# Patient Record
Sex: Female | Born: 1935 | Race: White | Hispanic: No | Smoking: Never smoker
Health system: Southern US, Community
[De-identification: ages and names within clinical notes are randomized; demographics above are authoritative.]

## PROBLEM LIST (undated history)

## (undated) DIAGNOSIS — E079 Disorder of thyroid, unspecified: Secondary | ICD-10-CM

## (undated) DIAGNOSIS — N390 Urinary tract infection, site not specified: Secondary | ICD-10-CM

## (undated) DIAGNOSIS — D649 Anemia, unspecified: Secondary | ICD-10-CM

## (undated) DIAGNOSIS — J449 Chronic obstructive pulmonary disease, unspecified: Secondary | ICD-10-CM

## (undated) DIAGNOSIS — I4891 Unspecified atrial fibrillation: Secondary | ICD-10-CM

## (undated) DIAGNOSIS — E119 Type 2 diabetes mellitus without complications: Secondary | ICD-10-CM

## (undated) DIAGNOSIS — I6200 Nontraumatic subdural hemorrhage, unspecified: Secondary | ICD-10-CM

## (undated) DIAGNOSIS — I509 Heart failure, unspecified: Secondary | ICD-10-CM

## (undated) HISTORY — PX: CORONARY ARTERY BYPASS GRAFT: SHX141

---

## 2013-01-23 ENCOUNTER — Ambulatory Visit: Payer: Self-pay | Admitting: Cardiovascular Disease

## 2013-01-23 ENCOUNTER — Observation Stay: Payer: Self-pay | Admitting: Internal Medicine

## 2013-01-23 LAB — CBC
HCT: 18.4 % — ABNORMAL LOW (ref 35.0–47.0)
HGB: 6 g/dL — ABNORMAL LOW (ref 12.0–16.0)
MCH: 30.1 pg (ref 26.0–34.0)
Platelet: 107 10*3/uL — ABNORMAL LOW (ref 150–440)
RBC: 2 10*6/uL — ABNORMAL LOW (ref 3.80–5.20)
RDW: 14.2 % (ref 11.5–14.5)
WBC: 4.7 10*3/uL (ref 3.6–11.0)

## 2013-01-23 LAB — COMPREHENSIVE METABOLIC PANEL
Albumin: 1.8 g/dL — ABNORMAL LOW (ref 3.4–5.0)
Alkaline Phosphatase: 55 U/L (ref 50–136)
BUN: 17 mg/dL (ref 7–18)
Creatinine: 0.54 mg/dL — ABNORMAL LOW (ref 0.60–1.30)
EGFR (African American): >60
EGFR (Non-African Amer.): >60
SGPT (ALT): 17 U/L (ref 12–78)
Sodium: 145 mmol/L (ref 136–145)

## 2013-01-23 LAB — URINALYSIS, COMPLETE
Bilirubin,UR: NEGATIVE
Glucose,UR: NEGATIVE mg/dL (ref 0–75)
Ph: 7 (ref 4.5–8.0)
RBC,UR: 1 /HPF (ref 0–5)
Specific Gravity: 1.041 (ref 1.003–1.030)
WBC UR: 2 /HPF (ref 0–5)

## 2013-01-23 LAB — PROTIME-INR
INR: 1.4
Prothrombin Time: 16.9 secs — ABNORMAL HIGH (ref 11.5–14.7)

## 2013-01-24 LAB — CBC WITH DIFFERENTIAL/PLATELET
Basophil %: 0.6 %
Eosinophil #: 0.1 10*3/uL (ref 0.0–0.7)
Eosinophil %: 2.1 %
Lymphocyte %: 17.1 %
MCH: 29.8 pg (ref 26.0–34.0)
MCHC: 34 g/dL (ref 32.0–36.0)
MCV: 88 fL (ref 80–100)
Monocyte #: 0.9 x10 3/mm (ref 0.2–0.9)
Monocyte %: 13.3 %
Neutrophil %: 66.9 %
RDW: 14.7 % — ABNORMAL HIGH (ref 11.5–14.5)

## 2013-01-25 LAB — CBC WITH DIFFERENTIAL/PLATELET
Eosinophil #: 0.3 10*3/uL (ref 0.0–0.7)
Eosinophil %: 4.1 %
HCT: 26.3 % — ABNORMAL LOW (ref 35.0–47.0)
HGB: 8.9 g/dL — ABNORMAL LOW (ref 12.0–16.0)
Lymphocyte %: 15.8 %
MCV: 89 fL (ref 80–100)
Monocyte #: 1 x10 3/mm — ABNORMAL HIGH (ref 0.2–0.9)
Neutrophil %: 65.4 %
Platelet: 124 10*3/uL — ABNORMAL LOW (ref 150–440)
RBC: 2.96 10*6/uL — ABNORMAL LOW (ref 3.80–5.20)
RDW: 14.7 % — ABNORMAL HIGH (ref 11.5–14.5)

## 2013-01-25 LAB — BASIC METABOLIC PANEL
Anion Gap: 2 — ABNORMAL LOW (ref 7–16)
Calcium, Total: 8.7 mg/dL (ref 8.5–10.1)
Co2: 36 mmol/L — ABNORMAL HIGH (ref 21–32)
Creatinine: 0.97 mg/dL (ref 0.60–1.30)
EGFR (Non-African Amer.): 55 — ABNORMAL LOW
Glucose: 80 mg/dL (ref 65–99)
Osmolality: 282 (ref 275–301)
Potassium: 4.3 mmol/L (ref 3.5–5.1)

## 2013-05-12 ENCOUNTER — Ambulatory Visit: Payer: Self-pay | Admitting: Gastroenterology

## 2013-05-21 ENCOUNTER — Ambulatory Visit: Payer: Self-pay | Admitting: Gastroenterology

## 2013-10-02 ENCOUNTER — Inpatient Hospital Stay: Payer: Self-pay | Admitting: Internal Medicine

## 2013-10-02 LAB — COMPREHENSIVE METABOLIC PANEL WITH GFR
Albumin: 3 g/dL — ABNORMAL LOW
Alkaline Phosphatase: 144 U/L — ABNORMAL HIGH
Anion Gap: 0 — ABNORMAL LOW
BUN: 18 mg/dL
Bilirubin,Total: 0.2 mg/dL
Calcium, Total: 8.9 mg/dL
Chloride: 101 mmol/L
Co2: 39 mmol/L — ABNORMAL HIGH
Creatinine: 1.06 mg/dL
EGFR (African American): 57 — ABNORMAL LOW
EGFR (Non-African Amer.): 49 — ABNORMAL LOW
Glucose: 111 mg/dL — ABNORMAL HIGH
Osmolality: 282
Potassium: 4.4 mmol/L
SGOT(AST): 24 U/L
SGPT (ALT): 31 U/L
Sodium: 140 mmol/L
Total Protein: 7 g/dL

## 2013-10-02 LAB — URINALYSIS, COMPLETE
Bacteria: NONE SEEN
Bilirubin,UR: NEGATIVE
Blood: NEGATIVE
Glucose,UR: NEGATIVE mg/dL
Ketone: NEGATIVE
Nitrite: POSITIVE
Ph: 7
Protein: NEGATIVE
RBC,UR: 1 /HPF
Specific Gravity: 1.011
Squamous Epithelial: NONE SEEN
WBC UR: 27 /HPF

## 2013-10-02 LAB — CBC WITH DIFFERENTIAL/PLATELET
Basophil #: 0.1 10*3/uL (ref 0.0–0.1)
Basophil %: 0.4 %
Eosinophil #: 0.2 10*3/uL (ref 0.0–0.7)
Eosinophil %: 1.2 %
Lymphocyte #: 0.8 10*3/uL — ABNORMAL LOW (ref 1.0–3.6)
MCH: 29.5 pg (ref 26.0–34.0)
MCHC: 32.4 g/dL (ref 32.0–36.0)
MCV: 91 fL (ref 80–100)
Neutrophil #: 12 10*3/uL — ABNORMAL HIGH (ref 1.4–6.5)
Neutrophil %: 86.3 %
RDW: 13.6 % (ref 11.5–14.5)
WBC: 13.9 10*3/uL — ABNORMAL HIGH (ref 3.6–11.0)

## 2013-10-02 LAB — TROPONIN I
Troponin-I: <0.02 ng/mL
Troponin-I: <0.02 ng/mL
Troponin-I: <0.02 ng/mL

## 2013-10-02 LAB — DRUG SCREEN, URINE
Barbiturates, Ur Screen: NEGATIVE (ref ?–200)
Cannabinoid 50 Ng, Ur ~~LOC~~: NEGATIVE (ref ?–50)
MDMA (Ecstasy)Ur Screen: NEGATIVE (ref ?–500)
Methadone, Ur Screen: NEGATIVE (ref ?–300)
Opiate, Ur Screen: POSITIVE (ref ?–300)
Phencyclidine (PCP) Ur S: NEGATIVE (ref ?–25)
Tricyclic, Ur Screen: NEGATIVE (ref ?–1000)

## 2013-10-02 LAB — MAGNESIUM: Magnesium: 1.9 mg/dL

## 2013-10-02 LAB — PROTIME-INR
INR: 1.1
Prothrombin Time: 14.4 s

## 2013-10-02 LAB — APTT: Activated PTT: 33.5 s

## 2013-10-02 LAB — CK TOTAL AND CKMB (NOT AT ARMC)
CK, Total: 128 U/L (ref 21–215)
CK-MB: 2.3 ng/mL (ref 0.5–3.6)

## 2013-10-02 LAB — LIPASE, BLOOD: Lipase: 459 U/L — ABNORMAL HIGH

## 2013-10-03 LAB — CBC WITH DIFFERENTIAL/PLATELET
Basophil %: 0.3 %
Eosinophil %: 0 %
Lymphocyte %: 5.8 %
MCH: 29.6 pg (ref 26.0–34.0)
MCHC: 33.5 g/dL (ref 32.0–36.0)
Monocyte %: 9.1 %
Neutrophil %: 84.8 %
Platelet: 172 10*3/uL (ref 150–440)
WBC: 20.2 10*3/uL — ABNORMAL HIGH (ref 3.6–11.0)

## 2013-10-03 LAB — CK TOTAL AND CKMB (NOT AT ARMC): CK-MB: 2.2 ng/mL (ref 0.5–3.6)

## 2013-10-03 LAB — BASIC METABOLIC PANEL
Anion Gap: 6 — ABNORMAL LOW (ref 7–16)
BUN: 20 mg/dL — ABNORMAL HIGH (ref 7–18)
Chloride: 101 mmol/L (ref 98–107)
Creatinine: 0.86 mg/dL (ref 0.60–1.30)
EGFR (Non-African Amer.): >60
Glucose: 217 mg/dL — ABNORMAL HIGH (ref 65–99)
Osmolality: 281 (ref 275–301)
Potassium: 3.9 mmol/L (ref 3.5–5.1)

## 2013-10-04 LAB — CBC WITH DIFFERENTIAL/PLATELET
Basophil #: 0.1 10*3/uL (ref 0.0–0.1)
Basophil %: 0.5 %
Eosinophil #: 0.1 10*3/uL (ref 0.0–0.7)
Eosinophil %: 1 %
HGB: 6.7 g/dL — ABNORMAL LOW (ref 12.0–16.0)
Lymphocyte #: 1.1 10*3/uL (ref 1.0–3.6)
Lymphocyte %: 9.1 %
MCHC: 33.2 g/dL (ref 32.0–36.0)
MCV: 88 fL (ref 80–100)
Monocyte #: 1.5 x10 3/mm — ABNORMAL HIGH (ref 0.2–0.9)
Monocyte %: 12.4 %
RBC: 2.29 10*6/uL — ABNORMAL LOW (ref 3.80–5.20)
RDW: 14.5 % (ref 11.5–14.5)

## 2013-10-04 LAB — URINE CULTURE

## 2013-10-04 LAB — HEMOGLOBIN
HGB: 7.8 g/dL — ABNORMAL LOW (ref 12.0–16.0)
HGB: 8.2 g/dL — ABNORMAL LOW (ref 12.0–16.0)

## 2013-10-05 LAB — CBC WITH DIFFERENTIAL/PLATELET
Basophil #: 0 10*3/uL (ref 0.0–0.1)
Basophil %: 0.3 %
Eosinophil #: 0.1 10*3/uL (ref 0.0–0.7)
Eosinophil %: 0.9 %
HGB: 7.6 g/dL — ABNORMAL LOW (ref 12.0–16.0)
MCHC: 33.3 g/dL (ref 32.0–36.0)
Monocyte #: 1.3 x10 3/mm — ABNORMAL HIGH (ref 0.2–0.9)
Monocyte %: 10.8 %
Neutrophil %: 79.9 %
Platelet: 130 10*3/uL — ABNORMAL LOW (ref 150–440)
RBC: 2.58 10*6/uL — ABNORMAL LOW (ref 3.80–5.20)
WBC: 12.4 10*3/uL — ABNORMAL HIGH (ref 3.6–11.0)

## 2013-10-05 LAB — BASIC METABOLIC PANEL
BUN: 15 mg/dL (ref 7–18)
Chloride: 99 mmol/L (ref 98–107)
Co2: 33 mmol/L — ABNORMAL HIGH (ref 21–32)
Creatinine: 0.71 mg/dL (ref 0.60–1.30)
EGFR (African American): >60
Glucose: 94 mg/dL (ref 65–99)
Sodium: 135 mmol/L — ABNORMAL LOW (ref 136–145)

## 2013-10-06 LAB — POTASSIUM: Potassium: 3.6 mmol/L (ref 3.5–5.1)

## 2013-10-06 LAB — CBC WITH DIFFERENTIAL/PLATELET
Eosinophil %: 1.2 %
HCT: 24.4 % — ABNORMAL LOW (ref 35.0–47.0)
Lymphocyte #: 0.9 10*3/uL — ABNORMAL LOW (ref 1.0–3.6)
Neutrophil #: 9.3 10*3/uL — ABNORMAL HIGH (ref 1.4–6.5)
Neutrophil %: 80.1 %
Platelet: 144 10*3/uL — ABNORMAL LOW (ref 150–440)
RBC: 2.79 10*6/uL — ABNORMAL LOW (ref 3.80–5.20)
RDW: 14 % (ref 11.5–14.5)

## 2013-10-06 LAB — MAGNESIUM: Magnesium: 1.6 mg/dL — ABNORMAL LOW

## 2013-10-06 LAB — SODIUM: Sodium: 134 mmol/L — ABNORMAL LOW (ref 136–145)

## 2013-10-07 ENCOUNTER — Encounter: Payer: Self-pay | Admitting: Internal Medicine

## 2013-10-07 LAB — HEMOGLOBIN: HGB: 9.4 g/dL — ABNORMAL LOW (ref 12.0–16.0)

## 2013-10-07 LAB — SODIUM: Sodium: 137 mmol/L (ref 136–145)

## 2013-10-09 ENCOUNTER — Encounter: Payer: Self-pay | Admitting: Internal Medicine

## 2013-10-15 LAB — CBC WITH DIFFERENTIAL/PLATELET
BASOS ABS: 1 %
Bands: 1 %
EOS PCT: 3 %
HCT: 30.1 % — AB (ref 35.0–47.0)
HGB: 9.9 g/dL — ABNORMAL LOW (ref 12.0–16.0)
Lymphocytes: 18 %
MCH: 30.4 pg (ref 26.0–34.0)
MCHC: 33 g/dL (ref 32.0–36.0)
MCV: 92 fL (ref 80–100)
MONOS PCT: 10 %
Metamyelocyte: 1 %
Myelocyte: 0 %
PLATELETS: 319 10*3/uL (ref 150–440)
RBC: 3.26 10*6/uL — ABNORMAL LOW (ref 3.80–5.20)
RDW: 15.8 % — AB (ref 11.5–14.5)
Segmented Neutrophils: 63 %
Variant Lymphocyte - H1-Rlymph: 3 %
WBC: 7.1 10*3/uL (ref 3.6–11.0)

## 2013-10-16 LAB — BASIC METABOLIC PANEL
Anion Gap: 2 — ABNORMAL LOW (ref 7–16)
BUN: 14 mg/dL (ref 7–18)
CALCIUM: 8.2 mg/dL — AB (ref 8.5–10.1)
CO2: 34 mmol/L — AB (ref 21–32)
CREATININE: 0.78 mg/dL (ref 0.60–1.30)
Chloride: 99 mmol/L (ref 98–107)
EGFR (African American): >60
GLUCOSE: 108 mg/dL — AB (ref 65–99)
Osmolality: 271 (ref 275–301)
Potassium: 4.1 mmol/L (ref 3.5–5.1)
Sodium: 135 mmol/L — ABNORMAL LOW (ref 136–145)

## 2013-10-16 LAB — MAGNESIUM: MAGNESIUM: 1.9 mg/dL

## 2013-10-25 LAB — CBC WITH DIFFERENTIAL/PLATELET
BASOS PCT: 0.5 %
Basophil #: 0 10*3/uL (ref 0.0–0.1)
Eosinophil #: 0 10*3/uL (ref 0.0–0.7)
Eosinophil %: 0.1 %
HCT: 31.7 % — AB (ref 35.0–47.0)
HGB: 10.7 g/dL — ABNORMAL LOW (ref 12.0–16.0)
LYMPHS PCT: 5.3 %
Lymphocyte #: 0.3 10*3/uL — ABNORMAL LOW (ref 1.0–3.6)
MCH: 31 pg (ref 26.0–34.0)
MCHC: 33.8 g/dL (ref 32.0–36.0)
MCV: 92 fL (ref 80–100)
MONOS PCT: 10.4 %
Monocyte #: 0.5 x10 3/mm (ref 0.2–0.9)
Neutrophil #: 4.4 10*3/uL (ref 1.4–6.5)
Neutrophil %: 83.7 %
PLATELETS: 216 10*3/uL (ref 150–440)
RBC: 3.46 10*6/uL — AB (ref 3.80–5.20)
RDW: 16.6 % — AB (ref 11.5–14.5)
WBC: 5.3 10*3/uL (ref 3.6–11.0)

## 2013-10-27 LAB — RAPID INFLUENZA A&B ANTIGENS

## 2013-11-09 ENCOUNTER — Encounter: Payer: Self-pay | Admitting: Internal Medicine

## 2014-03-22 ENCOUNTER — Inpatient Hospital Stay: Payer: Self-pay | Admitting: Internal Medicine

## 2014-03-22 LAB — URINALYSIS, COMPLETE
BACTERIA: NONE SEEN
BLOOD: NEGATIVE
Bilirubin,UR: NEGATIVE
Glucose,UR: NEGATIVE mg/dL (ref 0–75)
Hyaline Cast: 7
Ketone: NEGATIVE
Nitrite: NEGATIVE
Ph: 5 (ref 4.5–8.0)
RBC,UR: 9 /HPF (ref 0–5)
Specific Gravity: 1.024 (ref 1.003–1.030)
Squamous Epithelial: 1
WBC UR: 46 /HPF (ref 0–5)

## 2014-03-22 LAB — COMPREHENSIVE METABOLIC PANEL
ALBUMIN: 3.6 g/dL (ref 3.4–5.0)
ANION GAP: 6 — AB (ref 7–16)
AST: 427 U/L — AB (ref 15–37)
Alkaline Phosphatase: 489 U/L — ABNORMAL HIGH
BUN: 29 mg/dL — AB (ref 7–18)
Bilirubin,Total: 0.7 mg/dL (ref 0.2–1.0)
CREATININE: 1.17 mg/dL (ref 0.60–1.30)
Calcium, Total: 9.4 mg/dL (ref 8.5–10.1)
Chloride: 99 mmol/L (ref 98–107)
Co2: 34 mmol/L — ABNORMAL HIGH (ref 21–32)
EGFR (Non-African Amer.): 43 — ABNORMAL LOW
GFR CALC AF AMER: 50 — AB
Glucose: 224 mg/dL — ABNORMAL HIGH (ref 65–99)
Osmolality: 290 (ref 275–301)
POTASSIUM: 4.3 mmol/L (ref 3.5–5.1)
SGPT (ALT): 807 U/L — ABNORMAL HIGH (ref 12–78)
Sodium: 139 mmol/L (ref 136–145)
Total Protein: 8 g/dL (ref 6.4–8.2)

## 2014-03-22 LAB — CK-MB
CK-MB: 1.5 ng/mL (ref 0.5–3.6)
CK-MB: 1.7 ng/mL (ref 0.5–3.6)
CK-MB: 1.3 ng/mL (ref 0.5–3.6)

## 2014-03-22 LAB — CBC
HCT: 39 % (ref 35.0–47.0)
HGB: 12.5 g/dL (ref 12.0–16.0)
MCH: 29.1 pg (ref 26.0–34.0)
MCHC: 32 g/dL (ref 32.0–36.0)
MCV: 91 fL (ref 80–100)
Platelet: 122 10*3/uL — ABNORMAL LOW (ref 150–440)
RBC: 4.29 10*6/uL (ref 3.80–5.20)
RDW: 17.6 % — ABNORMAL HIGH (ref 11.5–14.5)
WBC: 8.6 10*3/uL (ref 3.6–11.0)

## 2014-03-22 LAB — LIPASE, BLOOD: LIPASE: 88 U/L (ref 73–393)

## 2014-03-22 LAB — TROPONIN I
TROPONIN-I: 1.4 ng/mL — AB
Troponin-I: 1.2 ng/mL — ABNORMAL HIGH
Troponin-I: 1.1 ng/mL — ABNORMAL HIGH

## 2014-03-22 LAB — PROTIME-INR
INR: 1.5
Prothrombin Time: 17.8 secs — ABNORMAL HIGH (ref 11.5–14.7)

## 2014-03-22 LAB — MAGNESIUM: MAGNESIUM: 1.8 mg/dL

## 2014-03-22 LAB — ACETAMINOPHEN LEVEL

## 2014-03-22 LAB — PHOSPHORUS: Phosphorus: 2.4 mg/dL — ABNORMAL LOW (ref 2.5–4.9)

## 2014-03-23 LAB — COMPREHENSIVE METABOLIC PANEL
ALBUMIN: 2.9 g/dL — AB (ref 3.4–5.0)
ALT: 512 U/L — AB (ref 12–78)
AST: 218 U/L — AB (ref 15–37)
Alkaline Phosphatase: 345 U/L — ABNORMAL HIGH
Anion Gap: 2 — ABNORMAL LOW (ref 7–16)
BUN: 23 mg/dL — ABNORMAL HIGH (ref 7–18)
Bilirubin,Total: 0.7 mg/dL (ref 0.2–1.0)
CALCIUM: 8.7 mg/dL (ref 8.5–10.1)
Chloride: 106 mmol/L (ref 98–107)
Co2: 33 mmol/L — ABNORMAL HIGH (ref 21–32)
Creatinine: 0.9 mg/dL (ref 0.60–1.30)
GFR CALC NON AF AMER: 60 — AB
GLUCOSE: 61 mg/dL — AB (ref 65–99)
OSMOLALITY: 283 (ref 275–301)
POTASSIUM: 3.6 mmol/L (ref 3.5–5.1)
Sodium: 141 mmol/L (ref 136–145)
TOTAL PROTEIN: 6.2 g/dL — AB (ref 6.4–8.2)

## 2014-03-24 LAB — COMPREHENSIVE METABOLIC PANEL
ALBUMIN: 2.7 g/dL — AB (ref 3.4–5.0)
Alkaline Phosphatase: 302 U/L — ABNORMAL HIGH
Anion Gap: 4 — ABNORMAL LOW (ref 7–16)
BUN: 15 mg/dL (ref 7–18)
Bilirubin,Total: 0.7 mg/dL (ref 0.2–1.0)
CHLORIDE: 105 mmol/L (ref 98–107)
CO2: 35 mmol/L — AB (ref 21–32)
CREATININE: 0.76 mg/dL (ref 0.60–1.30)
Calcium, Total: 8.4 mg/dL — ABNORMAL LOW (ref 8.5–10.1)
EGFR (Non-African Amer.): >60
GLUCOSE: 64 mg/dL — AB (ref 65–99)
OSMOLALITY: 286 (ref 275–301)
POTASSIUM: 3.5 mmol/L (ref 3.5–5.1)
SGOT(AST): 127 U/L — ABNORMAL HIGH (ref 15–37)
SGPT (ALT): 399 U/L — ABNORMAL HIGH (ref 12–78)
SODIUM: 144 mmol/L (ref 136–145)
Total Protein: 6 g/dL — ABNORMAL LOW (ref 6.4–8.2)

## 2014-03-25 LAB — PLATELET COUNT: PLATELETS: 101 10*3/uL — AB (ref 150–440)

## 2014-03-25 LAB — URINE CULTURE

## 2014-03-27 LAB — CULTURE, BLOOD (SINGLE)

## 2014-04-18 LAB — COMPREHENSIVE METABOLIC PANEL
ALBUMIN: 3.1 g/dL — AB (ref 3.4–5.0)
ANION GAP: 7 (ref 7–16)
Alkaline Phosphatase: 228 U/L — ABNORMAL HIGH
BUN: 25 mg/dL — AB (ref 7–18)
Bilirubin,Total: 0.8 mg/dL (ref 0.2–1.0)
CALCIUM: 8.6 mg/dL (ref 8.5–10.1)
Chloride: 95 mmol/L — ABNORMAL LOW (ref 98–107)
Co2: 35 mmol/L — ABNORMAL HIGH (ref 21–32)
Creatinine: 1.34 mg/dL — ABNORMAL HIGH (ref 0.60–1.30)
EGFR (Non-African Amer.): 37 — ABNORMAL LOW
GFR CALC AF AMER: 43 — AB
Glucose: 130 mg/dL — ABNORMAL HIGH (ref 65–99)
Osmolality: 280 (ref 275–301)
POTASSIUM: 4.4 mmol/L (ref 3.5–5.1)
SGOT(AST): 136 U/L — ABNORMAL HIGH (ref 15–37)
SGPT (ALT): 172 U/L — ABNORMAL HIGH (ref 12–78)
Sodium: 137 mmol/L (ref 136–145)
TOTAL PROTEIN: 6.7 g/dL (ref 6.4–8.2)

## 2014-04-18 LAB — PRO B NATRIURETIC PEPTIDE: B-TYPE NATIURETIC PEPTID: 8955 pg/mL — AB (ref 0–450)

## 2014-04-18 LAB — TROPONIN I
TROPONIN-I: 0.14 ng/mL — AB
Troponin-I: 0.18 ng/mL — ABNORMAL HIGH

## 2014-04-18 LAB — URINALYSIS, COMPLETE
Bacteria: NONE SEEN
Bilirubin,UR: NEGATIVE
Blood: NEGATIVE
Glucose,UR: NEGATIVE mg/dL (ref 0–75)
Ketone: NEGATIVE
NITRITE: NEGATIVE
Ph: 6 (ref 4.5–8.0)
Protein: 30
RBC,UR: 3 /HPF (ref 0–5)
Specific Gravity: 1.013 (ref 1.003–1.030)

## 2014-04-18 LAB — CBC
HCT: 36.7 % (ref 35.0–47.0)
HGB: 11.6 g/dL — ABNORMAL LOW (ref 12.0–16.0)
MCH: 29 pg (ref 26.0–34.0)
MCHC: 31.6 g/dL — ABNORMAL LOW (ref 32.0–36.0)
MCV: 92 fL (ref 80–100)
Platelet: 123 10*3/uL — ABNORMAL LOW (ref 150–440)
RBC: 4 10*6/uL (ref 3.80–5.20)
RDW: 17.8 % — ABNORMAL HIGH (ref 11.5–14.5)
WBC: 8.8 10*3/uL (ref 3.6–11.0)

## 2014-04-19 ENCOUNTER — Observation Stay: Payer: Self-pay | Admitting: Internal Medicine

## 2014-04-20 LAB — BASIC METABOLIC PANEL
Anion Gap: 5 — ABNORMAL LOW (ref 7–16)
BUN: 30 mg/dL — ABNORMAL HIGH (ref 7–18)
Calcium, Total: 8.1 mg/dL — ABNORMAL LOW (ref 8.5–10.1)
Chloride: 97 mmol/L — ABNORMAL LOW (ref 98–107)
Co2: 37 mmol/L — ABNORMAL HIGH (ref 21–32)
Creatinine: 1.06 mg/dL (ref 0.60–1.30)
EGFR (Non-African Amer.): 49 — ABNORMAL LOW
GFR CALC AF AMER: 57 — AB
GLUCOSE: 216 mg/dL — AB (ref 65–99)
Osmolality: 290 (ref 275–301)
Potassium: 3.6 mmol/L (ref 3.5–5.1)
Sodium: 139 mmol/L (ref 136–145)

## 2014-04-20 LAB — CBC WITH DIFFERENTIAL/PLATELET
BASOS PCT: 0.4 %
Basophil #: 0 10*3/uL (ref 0.0–0.1)
EOS PCT: 0 %
Eosinophil #: 0 10*3/uL (ref 0.0–0.7)
HCT: 34.4 % — ABNORMAL LOW (ref 35.0–47.0)
HGB: 11 g/dL — ABNORMAL LOW (ref 12.0–16.0)
LYMPHS ABS: 0.4 10*3/uL — AB (ref 1.0–3.6)
Lymphocyte %: 6.7 %
MCH: 29 pg (ref 26.0–34.0)
MCHC: 32.1 g/dL (ref 32.0–36.0)
MCV: 91 fL (ref 80–100)
MONO ABS: 0.4 x10 3/mm (ref 0.2–0.9)
MONOS PCT: 6.6 %
NEUTROS ABS: 5.7 10*3/uL (ref 1.4–6.5)
Neutrophil %: 86.3 %
PLATELETS: 111 10*3/uL — AB (ref 150–440)
RBC: 3.8 10*6/uL (ref 3.80–5.20)
RDW: 18 % — ABNORMAL HIGH (ref 11.5–14.5)
WBC: 6.6 10*3/uL (ref 3.6–11.0)

## 2014-04-21 LAB — URINE CULTURE

## 2014-04-23 LAB — CULTURE, BLOOD (SINGLE)

## 2014-12-07 ENCOUNTER — Ambulatory Visit: Payer: Self-pay

## 2015-01-29 NOTE — Consult Note (Signed)
Brief Consult Note: Diagnosis: Brady/Pre-op ortho/CAD.   Patient was seen by consultant.   Consult note dictated.   Recommend to proceed with surgery or procedure.   Orders entered.   Discussed with Attending MD.   Comments: IMP Sinus Huston FoleyBrady Fall/Syncope? CAD Pre-op ortho HTN Thyroid DM Hyperlipidemia GERD . PLAN Tele No clear indication for PPM Not on AVNodal blocking drugs Evaluate thyroid TSH/T4 for hypothroid? Consider Holter later as outpt She is an acceptable surgical risk at this point I do not rec temp pacer at this point I will continue to follow post op No changes in meds to modify risk.  Electronic Signatures: Dorothyann Pengallwood, Kandis Henry D (MD)  (Signed 25-Dec-14 12:43)  Authored: Brief Consult Note   Last Updated: 25-Dec-14 12:43 by Alwyn Peaallwood, Merritt Mccravy D (MD)

## 2015-01-29 NOTE — Consult Note (Signed)
Brief Consult Note: Diagnosis: right groin groin hematoma following cath.   Patient was seen by consultant.   Recommend further assessment or treatment.   Discussed with Attending MD.   Comments: At this time pressure has been held on the right groin.   Duplex US is ordered but no conclusive evidence of pseudo  vitals 154/60 with Heart rate of 50 no anticoagulation on board.  Plan await results of Duplex if + pseudo plan thrombin injection. if patient remains stable after pressure has been reapplied observation per Cardiology/medicine if patient destablizes again would proceed to angio.  Electronic Signatures: Levora DredgeSchnier, Mancel Lardizabal (MD)  (Signed 17-Apr-14 13:33)  Authored: Brief Consult Note   Last Updated: 17-Apr-14 13:33 by Levora DredgeSchnier, Najeeb Uptain (MD)

## 2015-01-29 NOTE — Discharge Summary (Signed)
PATIENT NAME:  Rachel Vazquez, HENNINGS MR#:  [id number] DATE OF BIRTH:  12-31-1928  DATE OF ADMISSION:  01/23/2013 DATE OF DISCHARGE:  01/25/2013  ADMITTING DIAGNOSIS: Right groin pain, and hematoma.   DISCHARGE DIAGNOSES: 1.  Right groin pain due to a hematoma, post cardiac catheterization.  2.  Coronary artery disease, status post coronary artery bypass grafting with patent bypass grafts. 3.  Acute blood loss anemia, status post transfusion. 4.  Diabetes.  5.  Hypertension.  6.  Hyperlipidemia.  7.  Gastroesophageal reflux disease.  8.  Hypothyroidism.  9.  Depression.  10.  History of chronic back and leg pain.   CONSULTANTS: Dr. Delana Meyer, as well as Dr. Humphrey Rolls.   EVALUATIONS: Admitting glucose 120, BUN 17, creatinine 0.54, sodium 145, potassium was 3.1, chloride 118, CO2 was 22, calcium was 5.3.  LFTs: Total protein 3.8, albumin of 1.8, bili total of 0.2, alk phos 55, AST was 10, ALT was 17. Troponin 0.07.   WBC 4.7, hemoglobin 6, platelet count was 107. Most recent hemoglobin on the 19th was 8.9, hematocrit 26.3, INR 1.4.   Chest x-ray on admission showed elevation of the right hemidiaphragm, possible pulmonary vascular congestion.   Ultrasound of the groin shows findings consistent with hematoma in the right groin region.   HOSPITAL COURSE: Please refer to H and P done by the admitting physician. The patient is an 81 year old white female who had an outpatient cardiac catheterization done by [facility]. The patient was discharged home after the catheterization. The patient went home and apparently she climbed a flight of stairs, came back to the hospital with pain in the right groin as well as a  softball-sized mass in the right groin. The patient's hemoglobin was noted to be 6.   A vascular consult was obtained in the ED, and they did a stat ultrasound which showed a hematoma, but no pseudoaneurysm. The patient was aggressively given 4 units of packed RBCs. Seen also by Dr. Humphrey Rolls of  cardiology. She was monitored in the hospital and she had no further evidence of expanding hematoma. Her hemoglobin remained stable. In terms of the coronary arteries she was noted to have patent vessels. Please refer to the cardiac cath. At this time, she is doing much better and is stable for discharge.   DISCHARGE MEDICATIONS: Celexa 40 daily, indapamide 1.25 mg daily, Lipitor 40 mg at bedtime, lisinopril 10 daily, Nexium 40 daily, 70/30, 100 units 2 times a day, Synthroid 112 mcg daily, carvedilol 6.25, 1 tablet p.o. b.i.d., Ambien 10 at bedtime, Metanx vitamin B complex  1 tablet p.o. b.i.d., OxyContin 10 one tablet p.o. b.i.d., home O2, 3 liters at all times as previously.   DIET: Low-sodium, low-fat, low-cholesterol, carbohydrate-controlled diet.   ACTIVITY: As tolerated, as doing previously. The patient to have resumption of home health and nurse. Please note, recertification with home health was done.   DISCHARGE FOLLOWUP: With Dr. Humphrey Rolls next week. Recommended a hemoglobin check at that time. Follow up with primary MD in 2 to 4 weeks. If worsening pain the patient instructed to return back to the hospital.   Note: Thirty-five minutes spent.     ____________________________ Lafonda Mosses. Posey Pronto, MD shp:dm D: 01/26/2013 08:19:33 ET T: 01/26/2013 08:59:49 ET JOB#: [id number]  cc: Dorethea Strubel H. Posey Pronto, MD, <Dictator> Alric Seton MD ELECTRONICALLY SIGNED 02/02/2013 13:05

## 2015-01-29 NOTE — Consult Note (Signed)
Brief Consult Note: Diagnosis: Right subtrochanteric hip fracture.   Comments: 81 year old female with comminuted, displaced right subtrochanteric hip fracture.  Will need medical clearance since pulse is quite low.  Diabetes, hypertension, high cholesterol present.  May well need transfusions with this severe a fracture.  Will plan surgery after medcial workup later today or tomorrow.  Electronic Signatures: Valinda HoarMiller, Jaycub Noorani E (MD)  (Signed 25-Dec-14 02:24)  Authored: Brief Consult Note   Last Updated: 25-Dec-14 02:24 by Valinda HoarMiller, Junelle Hashemi E (MD)

## 2015-01-29 NOTE — Consult Note (Signed)
Brief Consult Note: Diagnosis: Right subtrochanteric hip fracture.   Patient was seen by consultant.   Recommend to proceed with surgery or procedure.   Recommend further assessment or treatment.   Orders entered.   Discussed with Attending MD.   Comments: 81 [facility].  Brought to Emergency Room where exam and X-rays show a displaced subtrochanteric right hip fracture.  She has a significant cardiac history of cabg and stents.  Evaluated by medical service and cardiology and cleared for surgery with increased risks noted.  Discussed treatment with patient who agrees to surgery today.  Risks and benefits of surgery were discussed at length including but not limited to infection, non union, nerve or blood vessed damage, non union, need for repeat surgery, blood clots and lung emboli, and death.  Plan surgery today.  Exam:  Alert and comfortable.  circulation/sensation/motor function good right leg.   Short and rotated.  skin intact.  No other injuries noted.    X-rays:  as above  Rx: open reduction and internal fixation right femur today with long Trochanteric Fixation Nail device.  Electronic Signatures: Valinda HoarMiller, Vegas Fritze E (MD)  (Signed 25-Dec-14 15:45)  Authored: Brief Consult Note   Last Updated: 25-Dec-14 15:45 by Valinda HoarMiller, Johanne Mcglade E (MD)

## 2015-01-29 NOTE — Consult Note (Signed)
General Aspect possible pseudoaneurysm   Present Illness This is an 81 year old female who had an outpatient cardiac catheterization done by Dr. Neoma Laming. She was discharged home after the catheter and when she got home, she developed significant right groin pain. Her son noticed that she had a softball-sized mass in the right groin. She was urgently brought to the ER. Initially, there was thought that she may have had an acute bleed or pseudoaneurysm. A vascular surgery consult was obtained. The patient underwent stat urgent ultrasound, which showed a hematoma but no pseudoaneurysm. The patient was noted to be significantly anemic with a hemoglobin of 6. After applying some pressure, the size of the hematoma has come down and she has shown no sign of any further acute bleeding.  PAST MEDICAL HISTORY: Consistent with diabetes, hypertension, hyperlipidemia, history of coronary artery disease, status post CABG, GERD, hypothyroidism, depression, history of chronic back and leg pain.   Home Medications: Medication Instructions Status  CeleXA 40 mg oral tablet 1 tab(s) orally once a day Active  indapamide 1.25 mg oral tablet 1 tab(s) orally once a day (in the morning) Active  Lipitor 40 mg oral tablet 1 tab(s) orally once a day (at bedtime) Active  lisinopril 10 mg oral tablet 1 tab(s) orally once a day Active  NexIUM 40 mg oral delayed release capsule 1 cap(s) orally once a day Active  NovoLIN 70/30 human recombinant 70 units-30 units/mL subcutaneous suspension 100 unit(s) subcutaneous 2 times a day Active  Synthroid 112 mcg (0.112 mg) oral tablet 1 tab(s) orally once a day Active  carvedilol 6.25 mg oral tablet 1 tab(s) orally 2 times a day Active  Ambien 10 mg oral tablet 1 tab(s) orally once a day (at bedtime) Active  Metanx Vitamin B Complex oral capsule 1 cap(s) orally 2 times a day Active  OxyCONTIN 10 mg oral tablet, extended release 1 tab(s) orally 2 times a day Active    No Known  Allergies:   Case History:  Family History Non-Contributory   Social History negative tobacco, negative ETOH, negative Illicit drugs   Review of Systems:  Fever/Chills No   Cough No   Sputum No   Abdominal Pain No   Diarrhea No   Constipation No   Nausea/Vomiting No   SOB/DOE No   Chest Pain No   Telemetry Reviewed NSR   Dysuria No   Physical Exam:  GEN well developed, well nourished, ill appearing   HEENT PERRL, hearing intact to voice   NECK supple  trachea midline   RESP normal resp effort  no use of accessory muscles   CARD regular rate  no JVD   ABD denies tenderness  soft   EXTR large hematoma right groin   SKIN No rashes, No ulcers   NEURO follows commands, motor/sensory function intact   PSYCH alert, good insight   Hepatic:  17-Apr-14 13:13   Bilirubin, Total 0.2  Alkaline Phosphatase 55  SGPT (ALT) 17  SGOT (AST)  10  Total Protein, Serum  3.8  Albumin, Serum  1.8  Routine BB:  17-Apr-14 13:13   Crossmatch Unit 1 Ready  Crossmatch Unit 2 Ready  Crossmatch Unit 3 Cancelled  Crossmatch Unit 4 Cancelled  Result(s) reported on 23 Jan 2013 at 05:03PM.  ABO Group + Rh Type A Negative  Antibody Screen NEGATIVE (Result(s) reported on 23 Jan 2013 at 02:13PM.)  Routine Chem:  17-Apr-14 13:13   Glucose, Serum  120  BUN 17  Creatinine (comp)  0.54  Sodium, Serum 145  Potassium, Serum  3.1  Chloride, Serum  118  CO2, Serum 22  Calcium (Total), Serum  5.3  eGFR (African American) >60  eGFR (Non-African American) >60 (eGFR values <58m/min/1.73 m2 may be an indication of chronic kidney disease (CKD). Calculated eGFR is useful in patients with stable renal function. The eGFR calculation will not be reliable in acutely ill patients when serum creatinine is changing rapidly. It is not useful in  patients on dialysis. The eGFR calculation may not be applicable to patients at the low and high extremes of body sizes, pregnant women, and  vegetarians.)  Result Comment CALCIUM - RESULTS VERIFIED BY REPEAT TESTING.  - NOTIFIED OF CRITICAL VALUE  - C/ LINDA MCLAMB _0  01-23-13..Marland KitchenJO  - READ-BACK PROCESS PERFORMED.  Cardiac:  17-Apr-14 13:13   Troponin I 0.02 (0.00-0.05 0.05 ng/mL or less: NEGATIVE  Repeat testing in 3-6 hrs  if clinically indicated. >0.05 ng/mL: POTENTIAL  MYOCARDIAL INJURY. Repeat  testing in 3-6 hrs if  clinically indicated. NOTE: An increase or decrease  of 30% or more on serial  testing suggests a  clinically important change)  Routine UA:  17-Apr-14 13:13   Color (UA) Straw  Clarity (UA) Clear  Glucose (UA) Negative  Bilirubin (UA) Negative  Ketones (UA) Negative  Specific Gravity (UA) 1.041  Blood (UA) Negative  pH (UA) 7.0  Protein (UA) Negative  Nitrite (UA) Negative  Leukocyte Esterase (UA) 1+ (Result(s) reported on 23 Jan 2013 at 02:27PM.)  RBC (UA) 1 /HPF  WBC (UA) 2 /HPF  Bacteria (UA) NONE SEEN  Epithelial Cells (UA) <1 /HPF (Result(s) reported on 23 Jan 2013 at 02:27PM.)  Routine Coag:  17-Apr-14 13:13   Prothrombin  16.9  INR 1.4 (INR reference interval applies to patients on anticoagulant therapy. A single INR therapeutic range for coumarins is not optimal for all indications; however, the suggested range for most indications is 2.0 - 3.0. Exceptions to the INR Reference Range may include: Prosthetic heart valves, acute myocardial infarction, prevention of myocardial infarction, and combinations of aspirin and anticoagulant. The need for a higher or lower target INR must be assessed individually. Reference: The Pharmacology and Management of the Vitamin K  antagonists: the seventh ACCP Conference on Antithrombotic and Thrombolytic Therapy. CAGTXM.4680Sept:126 (3suppl): [id number] A HCT value >55% may artifactually increase the PT.  In one study,  the increase was an average of 25%. Reference:  "Effect on Routine and Special Coagulation Testing Values of Citrate  Anticoagulant Adjustment in Patients with High HCT Values." American Journal of Clinical Pathology 2006;126:400-405.)  Routine Hem:  17-Apr-14 13:13   WBC (CBC) 4.7  RBC (CBC)  2.00  Hemoglobin (CBC)  6.0  Hematocrit (CBC)  18.4  Platelet Count (CBC)  107 (Result(s) reported on 23 Jan 2013 at 01:31PM.)  MCV 92  MCH 30.1  MCHC 32.7  RDW 14.2   [place]    17-Apr-14 14:03, [place]o Aneurysm Groin Right  [place] Aneurysm Groin Right   REASON FOR EXAM:    bleeding post cath  COMMENTS:   LMP: Post-Menopausal    PROCEDURE: [place] - [place]SEUDOANEURYSM GROIN RIGHT  - Jan 23 2013  2:03PM     RESULT: Targeted ultrasound in the right groin demonstrates normal color   and spectral Doppler appearance in the right common femoral artery in the   right common femoral vein. There is no evidence of aneurysm or   pseudoaneurysm. There is no fistulous communication. There are  2   hematomas superficially. The more superior measures 3.52 x 2.38 x 2.67   cm. One slightly more inferior and possibly slightly more medial measures   3.45 x 1.56 x 3.29 cm.    IMPRESSION:   1. Findings consistent with hematomas in the right groin region. No     definite pseudoaneurysm or fistulous communication evident.    Dictation Site: 2        Verified By: Sundra Aland, M.D., MD    Impression 1.  Right groin hematoma        ultrasound negative for pseudoaneurysm        hemodynamically stable        no surgery at this time 2.  Coronary artery disease        plan per Cardiology 3.   Anemia         Transfuse per medicine   Plan level 3   Electronic Signatures: Hortencia Pilar (MD)  (Signed 17-Apr-14 18:11)  Authored: General Aspect/Present Illness, Home Medications, Allergies, History and Physical Exam, Labs, Radiology, Impression/Plan   Last Updated: 17-Apr-14 18:11 by Hortencia Pilar (MD)

## 2015-01-29 NOTE — Op Note (Signed)
PATIENT NAME:  Rachel Vazquez, SPARGER MR#:  [id number] DATE OF BIRTH:  [birth year]  DATE OF PROCEDURE:  10/02/2013  PREOPERATIVE DIAGNOSIS:  Pertrochanteric/subtrochanteric fracture of right hip with significance placement.   POSTOPERATIVE DIAGNOSIS: Pertrochanteric/subtrochanteric fracture of right hip with significance placement.   PROCEDURE PERFORMED: Open reduction and internal fixation of right hip with 11 mm x 380 mm, 130 degree trochanteric fixation nail device (100 mm helical blade, 48 mm distal locking screw).   SURGEON: Valinda Hoar, M.D.   ANESTHESIA: General endotracheal.   COMPLICATIONS: None.   DRAINS: Two Autovacs.   ESTIMATED BLOOD LOSS: 500 mL, replaced with 2 units of packed cells.   DESCRIPTION OF PROCEDURE: The patient was brought to the operating room where she underwent satisfactory general endotracheal anesthesia in the supine position. Attempts were made at a spinal anesthetic, but we were unsuccessful due to arthritis. The left leg was flexed and abducted, and the right leg was placed in traction and internally rotated and placed in a traction boot. Fluoroscopy showed good length and alignment. The proximal fragment was highly flexed as usual with subtrochanteric fracture. The hip was prepped and draped in a sterile fashion, and a longitudinal incision was made over the greater trochanteric area. The guidepin was inserted through the tip of the greater trochanter, and the large drill used to enlarge this opening. The proximal fragment was flexed so high that I extended the incision distally so that I could control the fracture fragments with a clamp. The guidepin was then passed down the shaft of the femur toward the knee. The femoral canal was reamed to 12.5 mm. A 130-degree, 11 mm x 380 mm right-sided trochanteric fixation nail was inserted to the appropriate depth. Stab wound was made laterally and the guide advanced for the pin in the head and neck. This pin was advanced and  was in good position on AP and lateral views. The lateral cortex was drilled, and the tract for the blade was drilled to 100 mm. A 100 mm helical blade was then inserted fully. This was well contained in the femoral head. The traction was released momentarily, and then fracture fragments reclamped. The set screw was tightened at the top of the nail. The proximal outrigger guides were then removed, along with the guidepin. A stab wound was made laterally just above the knee under fluoroscopic control, and a drill hole made through the distal hole in the rod. A 48 mm locking screw was inserted through the sliding hole here distally. This was seen to be in good position on AP and lateral view.  The final fluoroscopy showed the fragments to be in good overall alignment. Clamp was removed, and the proximal fragment remained in relatively close approximation. The wounds were irrigated. Two Autovacs were inserted. The fascia was closed with #2 quill, and the subcutaneous tissue was closed with 0 quill, each over an Autovac drain. The skin was closed with staples, as were all stab wounds. Dry sterile dressings were applied, and the Autovac was activated. The leg was taken out of traction and placed through range of motion and was stable. She was transferred to her hospital bed and extubated. She was taken to recovery in good condition. I did speak to the patient's son in the PACU at the end of the procedure, and let him know that she had come through surgery satisfactorily and was in the recovery room.    ____________________________ Valinda Hoar, MD hem:dmm D: 10/02/2013 19:29:34 ET T: 10/02/2013 19:46:29 ET  JOB#: [id number]  cc: Valinda HoarHoward E. Alistar Mcenery, MD, <Dictator> Valinda HoarHOWARD E Zyanne Schumm MD ELECTRONICALLY SIGNED 10/06/2013 13:19

## 2015-01-29 NOTE — H&P (Signed)
PATIENT NAME:  Rachel Vazquez, Rachel Vazquez MR#:  [id number] DATE OF BIRTH:  Jul 21, 1929  DATE OF ADMISSION:  01/23/2013  PRIMARY CARE PHYSICIAN: Dr. Adrian BlackwaterShaukat Khan.   CHIEF COMPLAINT: Right groin pain and hematoma along with anemia.   HISTORY OF PRESENT ILLNESS: This is an 81 year old female who had an outpatient cardiac catheterization done by Dr. Adrian BlackwaterShaukat Khan. She was discharged home after the catheter and when she got home, she developed significant right groin pain. Her son noticed that she had a softball-sized mass in the right groin. She was urgently brought to the ER. Initially, there was thought that she may have had an acute bleed or pseudoaneurysm. A vascular surgery consult was obtained. The patient underwent stat urgent ultrasound, which showed a hematoma but no pseudoaneurysm. The patient was noted to be significantly anemic with a hemoglobin of 6. After applying some pressure, the size of the hematoma has come down and she has shown no sign of any further acute bleeding. Due to her significant anemia and acute blood loss, hospitalist services were contacted for further treatment and evaluation. The patient denies any chest pain. She does admit to shortness of breath, which is chronic in nature. She also complains of chronic leg and back pain, but no other associated symptoms presently.   REVIEW OF SYSTEMS:  CONSTITUTIONAL: No documented fever. No weight gain or weight loss.  EYES: No blurry or double vision.  ENT: No tinnitus. No postnasal drip. No redness of the oropharynx.  RESPIRATORY: No cough, no wheeze, no hemoptysis. Positive dyspnea.  CARDIOVASCULAR: No chest pain, no orthopnea, no palpitations, no syncope.  GASTROINTESTINAL: No nausea, vomiting, diarrhea, no abdominal pain, no melena, no hematochezia.    GENITOURINARY: No dysuria or hematuria.  ENDOCRINE: No polyuria or nocturia. Heat or cold intolerance.  HEMATOLOGIC: Positive anemia,  acute bleeding. No bruising,  INTEGUMENTARY: No rashes. No  lesions.  MUSCULOSKELETAL: No arthritis. No swelling, no gout.  NEUROLOGIC: No numbness or tingling. No ataxia. No seizure-type activity.  PSYCHIATRIC: No anxiety. No insomnia. No ADD.   PAST MEDICAL HISTORY: Consistent with diabetes, hypertension, hyperlipidemia, history of coronary artery disease, status post CABG, GERD, hypothyroidism, depression, history of chronic back and leg pain.   ALLERGIES: No known drug allergies.   SOCIAL HISTORY: No smoking. No alcohol abuse. No illicit drug abuse. Currently resides at [facility] assisted living.   FAMILY HISTORY: The patient's mother died from complications of a pulmonary embolism. Father died from lung cancer.   CURRENT MEDICATIONS: Are as follows:  Ambien 10 mg at bedtime, Coreg 6.25 mg b.i.d., Celexa 40 mg daily, indapamide 1.25 mg daily, Lipitor 40 mg daily, lisinopril 10 mg daily, Metanx vitamin B complex 1 tab b.i.d., Nexium 40 mg daily, Novolin 70/30 100 units b.i.d., OxyContin 10 mg b.i.d., Synthroid 112 mcg daily, aspirin 325 mg daily and hydrocodone 5 mg q. 6 hours as needed.   PHYSICAL EXAMINATION: On admission is as follows:  VITAL SIGNS:  Are noted to be temperature 97.2, pulse 50 respirations 20, blood pressure 143/6, sats 100% percent on room air.  GENERAL: She is a pale appearing female in bed, but in no apparent distress.  HEENT: Atraumatic, normocephalic. Extraocular muscles are intact. Pupils are equally round and reactive to light. Sclerae is anicteric. No conjunctival injection. No pharyngeal erythema.  NECK: Supple. There is no jugular venous distention, no bruits, no lymphadenopathy, no thyromegaly.  HEART: Regular rate and rhythm. No murmurs, no rubs, no clicks.  LUNGS: Clear to auscultation bilaterally. No rales, no  rhonchi, no wheezes.  ABDOMEN: Soft, flat, nontender, nondistended. Has good bowel sounds. No hepatosplenomegaly appreciated.  EXTREMITIES: No evidence of any cyanosis, clubbing or peripheral edema. Has +2  pedal and radial pulses bilaterally.  NEUROLOGICAL: The patient is alert, awake, and oriented x 3 with no focal motor or sensory deficits appreciated bilaterally.  SKIN: Moist and warm, with no rashes appreciated.  LYMPHATIC: There is no cervical or axillary lymphadenopathy.   LABORATORY, DIAGNOSTIC AND RADIOLOGIC DATA:  Showed a serum glucose of 120, BUN 17, creatinine 0.5, sodium 145, potassium 3.1, chloride 118, bicarbonate 22. The patient's LFTs showed albumin 1.8, AST 10, ALT 17. Troponin less than 0.02. White cell count 4.4, hemoglobin 6, hematocrit 18.4, platelet count 107, INR is 1.4. Urinalysis within normal limits.   The patient had an ultrasound of the right groin, which showed findings consistent with hematoma in the right groin. No evidence of pseudoaneurysm or fistulous communication evident.    ASSESSMENT AND PLAN: This is an 81 year old female with a history of coronary artery disease, status post bypass, diabetes, hypertension, hyperlipidemia, gastroesophageal reflux disease, hypothyroidism, depression, history of chronic back and leg pain, who presents to the hospital after an outpatient catheterization with right groin pain and noted to have a hematoma. The patient noted to have acute blood loss anemia due to the hematoma.  1.  Anemia. This is likely acute blood loss anemia secondary to the hematoma, post catheterization. There was no evidence of pseudoaneurysm seen as evidenced by the ultrasound. I will go ahead and transfuse 2 units of packed red blood cells, follow hemoglobin. The patient does not show any evidence of acute bleeding presently. She is also hemodynamically stable and asymptomatic presently.  2.  History of coronary artery disease status post coronary artery bypass graft. The patient had a catheterization done today as outpatient by Dr. Welton Flakes, which showed patent grafts and no evidence of any critical coronary artery disease. I will hold aspirin now given the hematoma,  also hold the beta blockers as the patient is somewhat bradycardic. Continue statin and will resume other home medications, likely by tomorrow morning when she is a bit more stable  3.  Hypothyroidism. Continue Synthroid.  4.  Diabetes. Continue Novolin 70/30. Placed on carb-controlled diet. Follow blood sugars.  5.  Gastroesophageal reflux disease. Continue Nexium.  6.  Depression. Continue Celexa.  7.  Bradycardia. This is a sinus bradycardia. While they were applying pressure to the hematoma, the patient developed some mild bradycardic episode. She was given 2 doses of atropine, although presently remains with heart rates in the 50s and hemodynamically stable and asymptomatic. Cardiology will continue to follow patient.   CODE STATUS: The patient is a full code.   TIME SPENT: 50 minutes    ____________________________ Rolly Pancake. Cherlynn Kaiser, MD vjs:cc D: 01/23/2013 16:31:20 ET T: 01/23/2013 17:07:53 ET JOB#: [id number]  cc: Rolly Pancake. Cherlynn Kaiser, MD, <Dictator> Houston Siren MD ELECTRONICALLY SIGNED 01/23/2013 21:42

## 2015-01-30 NOTE — H&P (Signed)
PATIENT NAME:  Rachel Vazquez, Rachel Vazquez MR#:  [id number] DATE OF BIRTH:  October 05, 1929  DATE OF ADMISSION:  03/22/2014  PRIMARY CARE PHYSICIAN:  Odelia Gage.   REFERRING PHYSICIAN: Dr. York Cerise.   CHIEF COMPLAINT: Altered mental status.   HISTORY OF PRESENT ILLNESS: Rachel Vazquez is a 81 year old female with a history of coronary artery disease, status post coronary artery bypass grafting, hypertension, diabetes mellitus, congestive heart failure who has been under hospice care since last admission from December 2014 after being treated for a hip fracture. The patient has been having multiple episodes of pneumonia recently. Last pneumonia episode of pneumonia was about one month back. The patient was noted to be confused for the last three days. Per son, since the patient has been under hospice care, the patient had days of more increased somnolence. There were days when then patient keeps her most of her pills  in the mouth and sleeps. For the last 3 days the patient has been more confused from her usual state. Concerning this, the patient is brought to the Emergency Department. Work-up in the Emergency Department with chest x-ray shows patchy left basal opacity in the right paratracheal region. The patient is also found to have urinary tract infection with 1+ leukocyte esterase and WBC of 46. The patient has elevated lactic acid of 1.6. The patient currently denies having any shortness of breath, cough, abdominal pain. The patient received Rocephin and Zithromax in the Emergency Department.   PAST MEDICAL HISTORY: 1. Coronary artery disease status post coronary artery bypass grafting.  2. Ischemic cardiomyopathy with ejection fraction of 30% to 35%.  3. Diabetes mellitus, insulin-dependent.  4. Hypertension.  5. Hyperlipidemia.  6. Gastroesophageal reflux disease.  7. Hypothyroidism.  8. Depression.  9. Chronic back and leg pain.   PAST SURGICAL HISTORY: 1. Cholecystectomy.  2. Appendectomy.  3. Coronary  artery bypass grafting.    ALLERGIES: No known drug allergies.   HOME MEDICATIONS: 1. Vitamin D3 2000 units once a day. 2. Vitamin B12 1000 mcg once a day.  3.  75 mg 2 times a day.  4. Potassium chloride 10 mEq once a day.  5. Pepcid 20 mg once a day. 6. Novolin R 4 times a day.  7. Insulin 70/30 10 units once a day.  8. Norco 5/325  mg 1 tablet every four hours.  9. Nexium 40 mg once a day.   10. Levothyroxine 112 mcg once a day.  11. Guaifenesin  as needed.  12.  Lasix 20 mg once a day.  13. Docusate sodium once a day.  14. Citalopram 40 mg once a day.  15. Calcium carbonate 1 tablet 2 times a day.  16. Atorvastatin 40 mg once a day.  17. Ambien 10 mg once a day.   SOCIAL HISTORY: No history of smoking, drinking alcohol or using illicit drugs. Currently lives in an assisted living facility. The patient is DNR/DNI.   FAMILY HISTORY:  . Father died from lung cancer.  REVIEW OF SYSTEMS: Unable to obtain as the patient has confusion as well as very hard of hearing. However, were reviewed with  the patient and the son,    except as mentioned in  history of present illness.  PHYSICAL EXAMINATION:  IN GENERAL: This is a well-nourished, age-appropriate female lying down in the bed, not in distress.   VITAL SIGNS: Temperature 98.1, pulse 68, blood pressure 145/85, respiratory rate of 14, oxygen saturation 98% on 2 liters of oxygen.   HEENT: Head atraumatic, normocephalic.  Conjunctivae normal. Pupils equal and react to light. Extraocular movements are intact. Mucous membranes moist. No pharyngeal erythema.  NECK: Supple. No lymphadenopathy. No JVD. No carotid bruit.  CHEST: Has no focal tenderness.  LUNGS: Has decreased breath sounds in the right lower lobes.  HEART: S1, S2 regular. No murmurs are heard.  ABDOMEN: Bowel sounds present. Soft, nontender, nondistended. No hepatosplenomegaly.  EXTREMITIES: No pedal edema.  Marland Kitchen. NEUROLOGICAL: The patient is oriented to self and person but  not to time and place. No apparent cranial nerve abnormalities. Moving all four extremities.   LABORATORY DATA: A chest x-ray, one view, portable shows infiltration in the left base, as well as right paratracheal region.   UA 1+ leukocyte esterase and 46 WBC. Lactic acid of 1.6, magnesium 1.8. Complete metabolic panel: BUN 29, creatinine of 1.17, troponin of 1.40.   ASSESSMENT AND PLAN: Rachel Vazquez is a 81 year old female who comes with sepsis secondary to pneumonia and a urinary tract infection.  1. Sepsis. We will treat it as blood cultures have been obtained. Continue with vancomycin and cefepime.  2. Pneumonia. The patient has been being treated multiple episodes for community-acquired pneumonia. Also, highly concerning about aspiration pneumonia, especially when the patient is drowsy. Keep the patient on vancomycin  and cefepime.  3. Urinary tract infection: Obtain urine cultures. Follow up with the culture data.  4. Elevated troponin: The patient has a normal CK-MB, most likely the patient might have had myocardial infarction with the troponin might have been trending down. We will  continue to follow up with CK-MB and the troponins. We will hold any anticoagulation for now.  5. Deep vein thrombosis. The patient has been at baseline, walks with the help of a walker. For the last 3 to 4 days the patient is unable to walk, we will  start physical therapy.  6.  DNR/DNI. The patient is also on hospice care.  7. Keep the patient on deep vein thrombosis prophylaxis with Lovenox.   TIME SPENT: 55 minutes.  ____________________________ Susa GriffinsPadmaja Sulma Ruffino, MD pv:sg D: 03/22/2014 05:34:54 ET T: 03/22/2014 07:21:53 ET JOB#: [id number]  cc: Susa GriffinsPadmaja Kylie Simmonds, MD, <Dictator> [facility], [place]  Clerance LavPADMAJA Moani Weipert MD ELECTRONICALLY SIGNED 03/25/2014 7:24

## 2015-01-30 NOTE — Consult Note (Signed)
PATIENT NAME:  Rachel BlossomHESS, Nalea MR#:  [id number] DATE OF BIRTH:  [birth year]  DATE OF CONSULTATION:  03/22/2014  CONSULTING PHYSICIAN:  Laurier NancyShaukat A. Giles Currie, MD  INDICATION FOR CONSULTATION: Elevated troponin.   This is an 81 year old white female with a past medical history of coronary artery disease, [place],  [facility] She does not remember exactly, hypertension, diabetes mellitus, congestive heart failure under hospice care since December 2014 being treated for hip fracture. The patient has had multiple episodes of pneumonias in the past, presented this time to the hospital with altered mental status. Apparently, has urinary tract infection and elevated white count and incidentally she had an elevated troponin, thus I was asked to evaluate the patient. The patient denies chest pain. Does admit to some shortness of breath. Apparently, is getting physical therapy.   PAST MEDICAL HISTORY: Coronary artery disease status post CABG. She has severe left ventricular dysfunction, ejection fraction of 35% with ischemic cardiomyopathy, diabetes mellitus, hypertension, hyperlipidemia, GI reflux, hypothyroidism, depression, chronic back and leg pain, cholecystectomy, appendectomy, status post coronary artery bypass graft.   ALLERGIES: None.   SOCIAL HISTORY: She denies EtOH abuse or smoking.   MEDICATIONS: Insulin Lasix, Lipitor, 40 mg, potassium chloride.   FAMILY HISTORY: Unremarkable.   PHYSICAL EXAMINATION: GENERAL: She is alert and oriented x 2, a bit confused but is able to answer questions.  VITAL SIGNS: Her blood pressure is 155/75, respirations 20, pulse 77, temperature 98, saturation is 95%.  NECK: No JVD. No carotid bruit.  LUNGS: Clear.  HEART: Irregularly irregular pulse, bradycardic. Normal S1, S2. No audible murmur.  ABDOMEN: Soft, nontender, positive bowel sounds.   EXTREMITIES: No pedal edema.  NEUROLOGIC: She is alert, oriented x 2.    EKG was done which shows atrial fibrillation with slow ventricular response, rate 58 beats per minute, incomplete right bundle branch block, old inferior wall myocardial infarction.   LABORATORY DATA: BUN 29, creatinine 1.17. Troponin is 1.40 and 1.20 and 1.1.   ASSESSMENT AND PLAN: The patient has altered mental status probably related to urinary tract infection, had mildly elevated troponin and has atrial fibrillation with slow ventricular response rate on the EKG. She is not on any medication to slow her ventricular rate down. She may have most of her symptoms related to urinary tract infection. She is hospice care patient. She does have him mildly elevated troponin, but there is no acute EKG changes and no chest pain. We would like to just observe her and have conservative treatment. Advise repeating the echocardiogram and we will follow with you. Thank you very much for the referral.   ____________________________ Laurier NancyShaukat A. Clydie Dillen, MD sak:sg D: 03/22/2014 11:33:26 ET T: 03/22/2014 11:58:44 ET JOB#: [id number]  cc: Laurier NancyShaukat A. Gerrard Crystal, MD, <Dictator> Laurier NancySHAUKAT A Perrin Gens MD ELECTRONICALLY SIGNED 04/22/2014 9:21

## 2015-01-30 NOTE — Discharge Summary (Signed)
PATIENT NAME:  Rachel Vazquez, Rachel Vazquez MR#:  [id number] DATE OF BIRTH:  28-Mar-1929  DATE OF ADMISSION:  03/22/2014 DATE OF DISCHARGE:  03/25/2014  PRIMARY CARE PHYSICIAN: Dr. Avis EpleyMcGranaghan.   DISCHARGE DIAGNOSES:  1. Sepsis due to urinary tract infection.  2. Dehydration.  3. Metabolic encephalopathy.  4. Elevated troponin due to demand ischemia.  5. Chronic systolic and diastolic congestive heart failure with ejection fraction 34% to 40%.  6. Atrial fibrillation.  7. Coronary artery disease.  8. Hypertension.  9. Diabetes.   CONDITION: Stable.   CODE STATUS: DNR.   HOME MEDICATIONS: Please refer to the medication reconciliation list.   DIET: Low-sodium, low-fat, low-cholesterol, ADA diet.   ACTIVITY: As tolerated.   FOLLOWUP CARE: Follow up with PCP within 1 to 2 weeks. Follow up with Dr. Welton FlakesKhan, cardiology, within 1 to 2 weeks.   REASON FOR ADMISSION: Altered mental status.   HOSPITAL COURSE: The patient is an 81 year old female with a history of CAD, hypertension, diabetes who was sent to the ED due to altered mental status. In the ED, the patient's chest x-ray showed patchy left basilar opacity. The patient was also found to have a urinary tract infection with WBCs 46%. Lactic acid 1.6. Laboratory data showed BUN 29, creatinine 1.13. Troponin 1.4. Magnesium 1.8. For detailed history and physical examination, please refer to the admission note dictated by Dr. Heron NayVasireddy.  1. Sepsis possibly due to UTI. The patient was treated with IV antibiotics initially and changed to p.o. Cipro.  2. Elevated troponin. Possibly due to demand ischemia. The patient's ejection fraction is 35 to 40%. The patient has no chest pain.  3. Metabolic encephalopathy due to UTI and sepsis. The patient's symptoms improved to her baseline.   She has no complaints. Vital signs are stable. She is clinically stable. Will be discharged to home today. She was discharged to home yesterday. I discussed the patient's discharge plan  with the patient, family, nurse, case Production designer, theatre/television/filmmanager.   TIME SPENT: About 36 minutes.   ____________________________ Shaune PollackQing Chen, MD qc:gb D: 03/26/2014 15:21:28 ET T: 03/27/2014 00:06:29 ET JOB#: [id number]  cc: Shaune PollackQing Chen, MD, <Dictator> Shaune PollackQING CHEN MD ELECTRONICALLY SIGNED 03/27/2014 17:13

## 2015-01-30 NOTE — Discharge Summary (Signed)
PATIENT NAME:  Rachel Vazquez, Rachel Vazquez DATE OF BIRTH:  1928-11-14  DATE OF ADMISSION:  10/02/2013 DATE OF DISCHARGE:  10/07/2013  ADMITTING DIAGNOSIS: Femur fracture.   DISCHARGE DIAGNOSES: 1.  Right femur fracture pertrochanteric as well as subtrochanteric status post open reduction internal fixation on the 25th of December 2014 by Dr. Deeann SaintHoward Miller.  2.  Acute posthemorrhagic postoperative anemia status post 2 units of packed red blood cell transfusion.  3.  Sinus bradycardia, resolved. 4.  Diabetes mellitus, insulin-dependent.  5.  Hypertension. 6.  Urinary tract infection, Escherichia coli.  7.  Right lung atelectasis.  8.  Leukocytosis due to urinary tract infection, resolving.  9.  Hypothyroidism. 10.  Gastroesophageal reflux disease.  11.  Hyperlipidemia.   DISCHARGE CONDITION: Stable.   DISCHARGE MEDICATIONS: The is to resume: 1.  Celexa 40 mg p.o. daily. 2.  Lipitor 40 mg p.o. at bedtime. 4.  Lisinopril 10 mg p.o. daily. 5.  Synthroid 112 mcg p.o. daily. 6.  Vitamin B complex 1 capsule twice daily. 7.  Novolin 70/30 25 units subcutaneously in the morning and 50 units at bedtime. 8.  Ambien 5 mg bedtime.  9.  Acetaminophen/hydrocodone 325/5 mg 1 tablet every 4 to 6 hours as needed.  10.  Calcium carbonate 500 mg 2 tablets 4 times daily as needed. 11.  Enoxaparin 30 mg subcutaneously twice daily.  Length of therapy to be determined by Dr. Deeann SaintHoward Miller.  12.  Iron sulfate 325 mg 1 tablet twice daily.  13.  Bisacodyl 10 mg rectally at bedtime as needed.  14.  Docusate calcium 240 mg p.o. daily at bedtime.  15.  Calcium with vitamin D 500/200 one tablet twice a day.   HOME OXYGEN: None.   DIET: 2 grams salt, low-fat, low-cholesterol, carbohydrate-controlled diet, regular consistency.   ACTIVITY LIMITATIONS: As tolerated.    DISCHARGE INSTRUCTIONS: Followup appointment with Ms. McGranahan in 2 days after discharge, Dr. Deeann SaintHoward Miller in 1 week after discharge. The  patient is referred to physical therapy 2 to 7 times a week.  CONSULTANTS: Dr. Deeann SaintHoward Miller, care management, social work, Dr. Martha ClanKrasinski, Dr. Juliann Paresallwood.   RADIOLOGIC STUDIES: Right femur on the 25th of December revealed comminuted fracture involving the proximal right femoral diaphysis. The lesser femoral trochanter appears intact in assessment of these images and right hip radiographs. Incomplete fracture line again noted extending towards the right femoral trochanter with medially displaced smaller cortical fragments, 7 cm medial displacement and shortening noted at the fracture site.   Right hip complete x-ray, 25th of December 2014, revealed comminuted fracture involving the proximal right femoral diaphysis just distal to the greater femoral trochanter, associated incomplete fracture line extends towards the greater femoral trochanter and small lesser trochanter fragments are seen, 7 cm of shortening and medial displacement noted at the fracture site.   Chest, portable single view, 25th of December 2014, revealed significant elevation of the right hemidiaphragm, mild vascular congestion noted. No displaced rib fractures seen. Moderate hiatal hernia suggested. Mild cardiomegaly was also noted.  C-arm, 2 views, right hip, 25th of December 2014, revealed ORIF of the proximal right femur with placement of gamma nail.   Right femur postoperative x-ray, 25th of December 2014, revealed post ORIF of the comminuted oblique proximal right femoral fracture.   Repeated chest x-ray, portable single view, 28th of December 2014 showed moderate-sized hiatal hernia, enlargement of cardiac silhouette, status post coronary bypass grafting, and new right basilar atelectasis was noted.   HISTORY AND HOSPITAL COURSE:  The patient is an 81 year old Caucasian female with past medical history significant for history of multiple medical problems including hypertension, hyperlipidemia, also diabetes mellitus and coronary  artery disease who presented to the hospital after fall with right hip pain. Please refer to Dr. Clarita Leber admission on the 25th of December 2014. The patient's initial vital signs revealed temperature of 98.2, pulse was 50, respiration rate was 16, blood pressure 155/51, and saturation was 92% on 3 liters of oxygen through nasal cannula. Physical exam was unremarkable.   The patient's lab data done on arrival to the hospital showed elevation of glucose to 111, CO2 level was 39, otherwise BMP was unremarkable. Lipase level was slightly elevated at 459. Liver enzymes revealed albumin level of 3 and alkaline phosphatase 144. Cardiac enzymes x3 were within normal limits. Urine drug screen was positive for opiates. On the patient's CBC, white blood cell count was elevated to 13.9, hemoglobin was 9.2, and platelet count was 191. Absolute neutrophil count was elevated to 12. Urinalysis revealed yellow clear urine, negative for glucose, bilirubin or ketones. Specific gravity was 1.011, pH was 7, negative for blood as well as protein, positive for nitrites, 2+ leukocyte esterase, 1 red blood cell and 27 white blood cells were noted, as well as mucus was present. Urine cultures revealed more than 100,000 colony-forming units of E. coli, pansensitive to all antibiotics. The patient's chest x-ray was unremarkable.   The patient was admitted to the hospital for further evaluation. Cardiology consultation was obtained due to bradycardia as well as preoperative clearance. The patient was seen by Dr. Juliann Pares for the same and felt that for bradycardia there was no clear indication for permanent pacemaker. Recommended to check TSH as well as consider Holter later as outpatient if heart rate does not improve. The patient's TSH was checked on the 26th of December 2014 and was found to be normal at 0.552. The patient's heart rate normalized, improved, with no intervention, and on the day of discharge it was between 60 to 70. She  underwent surgical operation on the 25th of December 2014. Postoperatively, she was noted to be hypovolemic and severely anemic. Hemoglobin level dropped down from 9.7 on the day of admission to 7.8. She was transfused 2 units of packed red blood cells after which the patient's hemoglobin level improved to 8.4/9.4 level and remained stable until the day of discharge. The patient was observed by Dr. Deeann Saint for anemia and the decision will be made in regards to length of Lovenox therapy. In regards to sinus bradycardia, as mentioned above, the patient's bradycardia resolved. TSH was normal. In regards to diabetes mellitus, it was insulin-dependent, the patient is to resume her outpatient medications. The patient's Hemoglobin A1c was checked and was found to be 6.. It is recommended to follow the patient's oral intake and make decisions about decreasing dose of insulin as needed. In regards to urinary tract infection, the patient received IV Rocephin for a few days and her discomfort resolved. For hypertension, the patient is to continue her outpatient management. No changes were made. The patient was noted to have mild elevation of white blood cell count which was felt to be related to urinary tract infection. It is recommended to follow the patient's white blood cell count as outpatient and make decisions about any other antibiotic therapy if needed. The patient had a chest x-ray repeated on the 28th of December 2014 which showed right-sided atelectasis; however, no pneumonia was noted and no signs of symptoms of  pneumonia were evident on the discharge.  On the day of discharge, 30th of December 2014, the patient was afebrile with temperature of 98, pulse was ranging from 60 to 68, respiration rate was 20, blood pressure was 148/72, and saturation was 99% on 2 liters of oxygen through nasal cannula at rest.   The patient is being discharged to skilled nursing facility for rehabilitation. In regards to  hypothyroidism, gastroesophageal reflux disease as well as hyperlipidemia the patient is to continue her outpatient management. No changes were made.  TIME SPENT: 40 minutes. ____________________________ Katharina Caper, MD rv:sb D: 10/07/2013 12:15:58 ET T: 10/07/2013 12:43:30 ET JOB#: [id number]  cc: Katharina Caper, MD, <Dictator> [facility] [place], [place] Valinda Hoar, MD Katharina Caper MD ELECTRONICALLY SIGNED 10/14/2013 14:32

## 2015-01-30 NOTE — Discharge Summary (Signed)
Dates of Admission and Diagnosis:  Date of Admission 19-Apr-2014   Date of Discharge 21-Apr-2014   Admitting Diagnosis altered mental status   Final Diagnosis Altered mental status on admission- UTI and COPD UTI- Ur cx- E coli- resistant to Levaquin and bactrim, sensitive to cefriaxone and Nitrofurantoin. COPD- Ch oxygen use A fib, rate control.    Chief Complaint/History of Present Illness an 81 year old female with past medical history of chronic obstructive pulmonary disease, hypertension, congestive heart failure, who presented to the Emergency Department with complaints of cough, shortness of breath for the last few days. The patient has been gradually getting worse. The patient was admitted recently in June with sepsis from urinary tract infection. The patient states she did well a few days after the discharge. However, the patient has been gradually getting worse, to the point of unable to walk, having cough with mild productive sputum and shortness of breath. Also noticed to have increased swelling in the lower extremities. The patient spends most of the day in a wheelchair. However, she keeps her legs elevated. Concerning this, the patient is brought to the Emergency Department. On work-up in the Emergency Department the patient noted to have mild urinary tract infection. The patient is also found to have decreased oxygen saturations. The patient's BNP is 9000.   Allergies:  No Known Allergies:   Hepatic:  11-Jul-15 15:33   Bilirubin, Total 0.8  Alkaline Phosphatase  228 (45-117 NOTE: New Reference Range 08/29/13)  SGPT (ALT)  172  SGOT (AST)  136  Total Protein, Serum 6.7  Albumin, Serum  3.1  Routine Micro:  11-Jul-15 16:26   Organism Name ESCHERICHIA COLI  Organism Quantity >100,000 CFU/ML  Nitrofurantoin Sensitivity S  Cefazolin Sensitivity S  Ampicillin Sensitivity R  Ceftriaxone Sensitivity S  Ciprofloxacin Sensitivity R  Gentamicin Sensitivity R  Imipenem  Sensitivity S  Levofloxacin Sensitivity R  Trimethoprim/Sulfamethoxazole Sensitivty R  Ertapenem Sensitivity S  Cefoxitin Sensitivity S  Micro Text Report URINE CULTURE   ORGANISM 1                >100,000 CFU/ML ESCHERICHIA COLI   ANTIBIOTIC                    ORG#1     AMPICILLIN                    R         CEFAZOLIN                     S         CEFOXITIN                     S         CEFTRIAXONE                   S         CIPROFLOXACIN                 R         ERTAPENEM                     S         GENTAMICIN                    R         IMIPENEM  S         LEVOFLOXACIN                  R         NITROFURANTOIN                S         TRIMETHOPRIM/SULFAMETHOXAZOLE R  Specimen Source CLEAN CATCH  Organism 1 >100,000 CFU/ML ESCHERICHIA COLI  Result(s) reported on 21 Apr 2014 at 10:25AM.  Culture Comment ID TO FOLLOW SENSITIVITIES TO FOLLOW  Result(s) reported on 20 Apr 2014 at 11:01AM.  Lab:  11-Jul-15 16:15   Lactic Acid, Cardiopulmonary  1.7 (Result(s) reported on 18 Apr 2014 at 05:09PM.)  Routine Chem:  11-Jul-15 15:33   Glucose, Serum  130  BUN  25  Creatinine (comp)  1.34  Sodium, Serum 137  Potassium, Serum 4.4  Chloride, Serum  95  CO2, Serum  35  Calcium (Total), Serum 8.6  Anion Gap 7  Osmolality (calc) 280  eGFR (African American)  43  eGFR (Non-African American)  37 (eGFR values <44m/min/1.73 m2 may be an indication of chronic kidney disease (CKD). Calculated eGFR is useful in patients with stable renal function. The eGFR calculation will not be reliable in acutely ill patients when serum creatinine is changing rapidly. It is not useful in  patients on dialysis. The eGFR calculation may not be applicable to patients at the low and high extremes of body sizes, pregnant women, and vegetarians.)  Result Comment TROPONIN - RESULTS VERIFIED BY REPEAT TESTING.  - READ-BACK PROCESS PERFORMED.  - C/JENNIFER JEFFERSON AT 1612 ON  04/18/14  - [place] Result(s) reported on 18 Apr 2014 at 04:17PM.  B-Type Natriuretic Peptide ([facility]  8955 (Result(s) reported on 18 Apr 2014 at 04:14PM.)  13-Jul-15 06:27   Glucose, Serum  216  BUN  30  Creatinine (comp) 1.06  Sodium, Serum 139  Potassium, Serum 3.6  Chloride, Serum  97  CO2, Serum  37  Calcium (Total), Serum  8.1  Anion Gap  5  Osmolality (calc) 290  eGFR (African American)  57  eGFR (Non-African American)  49 (eGFR values <618mmin/1.73 m2 may be an indication of chronic kidney disease (CKD). Calculated eGFR is useful in patients with stable renal function. The eGFR calculation will not be reliable in acutely ill patients when serum creatinine is changing rapidly. It is not useful in  patients on dialysis. The eGFR calculation may not be applicable to patients at the low and high extremes of body sizes, pregnant women, and vegetarians.)  Cardiac:  11-Jul-15 15:33   Troponin I  0.14 (0.00-0.05 0.05 ng/mL or less: NEGATIVE  Repeat testing in 3-6 hrs  if clinically indicated. >0.05 ng/mL: POTENTIAL  MYOCARDIAL INJURY. Repeat  testing in 3-6 hrs if  clinically indicated. NOTE: An increase or decrease  of 30% or more on serial  testing suggests a  clinically important change)    17:46   Troponin I  0.18 (0.00-0.05 0.05 ng/mL or less: NEGATIVE  Repeat testing in 3-6 hrs  if clinically indicated. >0.05 ng/mL: POTENTIAL  MYOCARDIAL INJURY. Repeat  testing in 3-6 hrs if  clinically indicated. NOTE: An increase or decrease  of 30% or more on serial  testing suggests a  clinically important change)  Routine UA:  11-Jul-15 16:26   Color (UA) Yellow  Clarity (UA) Hazy  Glucose (UA) Negative  Bilirubin (UA) Negative  Ketones (UA) Negative  Specific Gravity (UA) 1.013  Blood (  UA) Negative  pH (UA) 6.0  Protein (UA) 30 mg/dL  Nitrite (UA) Negative  Leukocyte Esterase (UA) 1+ (Result(s) reported on 18 Apr 2014 at 04:45PM.)  RBC (UA) 3 /HPF  WBC (UA) 12  /HPF  Bacteria (UA) NONE SEEN  Epithelial Cells (UA) 3 /HPF (Result(s) reported on 18 Apr 2014 at 04:45PM.)  Routine Hem:  11-Jul-15 15:33   WBC (CBC) 8.8  RBC (CBC) 4.00  Hemoglobin (CBC)  11.6  Hematocrit (CBC) 36.7  Platelet Count (CBC)  123 (Result(s) reported on 18 Apr 2014 at 04:04PM.)  MCV 92  MCH 29.0  MCHC  31.6  RDW  17.8  13-Jul-15 06:27   WBC (CBC) 6.6  RBC (CBC) 3.80  Hemoglobin (CBC)  11.0  Hematocrit (CBC)  34.4  Platelet Count (CBC)  111  MCV 91  MCH 29.0  MCHC 32.1  RDW  18.0  Neutrophil % 86.3  Lymphocyte % 6.7  Monocyte % 6.6  Eosinophil % 0.0  Basophil % 0.4  Neutrophil # 5.7  Lymphocyte #  0.4  Monocyte # 0.4  Eosinophil # 0.0  Basophil # 0.0 (Result(s) reported on 20 Apr 2014 at 06:41AM.)   PERTINENT RADIOLOGY STUDIES: XRay:    11-Jul-15 15:53, Chest Portable Single View  Chest Portable Single View   REASON FOR EXAM:    Shortness of Breath  COMMENTS:       PROCEDURE: DXR - DXR PORTABLE CHEST SINGLE VIEW  - Apr 18 2014  3:53PM     CLINICAL DATA:  Shortness of breath    EXAM:  PORTABLE CHEST - 1 VIEW    COMPARISON:  03/22/2014, 10/05/2013, and 01/23/2013    FINDINGS:  There changes of median sternotomy for CABG. Cardiomegaly appears  stable. Mediastinal contours appear similar to chest radiograph of  01/23/2013. Lung volumes are quite low, similar to multiple prior  examinations. Chronic elevation the right hemidiaphragm noted. There  is a probable hiatal hernia contour, similar to prior study.  Pulmonary vascularity appears within normal limits. No  consolidation, pulmonary edema, or pleural effusion is visualized.  No acute osseous abnormality identified.     IMPRESSION:  Low lung volumes. Stable chronic cardiomegaly, s/p CABG. No acute  abnormality identified.      Electronically Signed    By: Curlene Dolphin M.D.    On: 04/18/2014 16:02     Verified By: Sheppard Evens, M.D.,  LabUnknown:  PACS Image    Pertinent Past  History:  Pertinent Past History 1.  Coronary artery disease, status post coronary artery bypass grafting.  2.  Ischemic cardiomyopathy with ejection fraction of 30% to 35%.  3.  Insulin-dependent diabetes mellitus.  4.  Hypertension.  5.  Hyperlipidemia.  6.  Gastroesophageal reflux disease. 7.  Hypothyroidism.  8.  Depression.  9.  Chronic back and leg pain.  10.  Obesity.   Hospital Course:  Hospital Course * Altered mental status- present on admission   Due to UTI and COPD exacerbation.   Improved now.  * COPD exacerbation   Oral steroids, nebs, Rocephin+ Azithromycin.   On baseline oxygen at home. no wheezing now.  * Ac systolic CHF   EF 70%   IV lasix had good diuresis- swich to oral BID.   On hospice at home- for CHF  * UTI   IV rocephin- Bl cx negative. Ur cx- Gram negative rods. sensitive to nitrofurontoin- d/c on oral meds for 4 days.  * A fib   Not on anticoagulation due to  old age.   Started on cardizem for rate control. swiched to home meds- coreg. controlled now.  * Hx of CABG- Mild elevated troponin- due to CHF   No further intervention.  * Weakness   Need Higher level of care- may be Assisted living-   PT eval.   Condition on Discharge Stable   Code Status:  Code Status No Code/Do Not Resuscitate   DISCHARGE INSTRUCTIONS HOME MEDS:  Medication Reconciliation: Patient's Home Medications at Discharge:     Medication Instructions  colace sodium 100 mg oral capsule  1 cap(s) orally 2 times a day   miralax - oral powder for reconstitution  17 gram(s) orally mixed with 8 oz. water, juice, soda, coffee or tea once a day, As Needed - for Constipation   lyrica 75 mg oral capsule  1 cap(s) orally 2 times a day   levothyroxine 112 mcg (0.112 mg) oral tablet  1 tab(s) orally once a day   carvedilol 3.125 mg oral tablet  1 tab(s) orally 2 times a day with food   senna 8.6 mg oral tablet  1 tab(s) orally once a day   novolin r human recombinant 100 units/ml  injectable solution   injectable , As Needed sliding scale   pepcid 20 mg oral tablet  1 tab(s) orally once a day (at bedtime)   novolin 70/30 human recombinant 70 units-30 units/ml subcutaneous suspension  10 unit(s) subcutaneous once a day (at bedtime)   calcium carbonate 400 mg -oral tablet, chewable  vitamin D3 200 (500) 1 tab(s) orally 2 times a day   citalopram 40 mg oral tablet  1 tab(s) orally once a day   albuterol-ipratropium 2.5 mg-0.5 mg/3 ml inhalation solution  3 milliliter(s) inhaled 2 times a day   advair diskus 250 mcg-50 mcg inhalation powder  1 puff(s) inhaled 2 times a day; rinse mouth after use   vitamin d3 2000 intl units oral capsule  1 cap(s) orally once a day   potassium citrate 10 meq oral tablet, extended release  1 tab(s) orally once a day   prednisone 10 mg oral tablet  Start at 60 mg and taper by 10 mg daily until complete   lisinopril 5 mg oral tablet  1 tab(s) orally once a day   ambien 5 mg oral tablet  1 tab(s) orally once a day (at bedtime), As Needed for sleep   furosemide 20 mg oral tablet  1 tab(s) orally 2 times a day   cephalexin 250 mg oral tablet  1 tab(s) orally 4 times a day x 4 days   oxycodone 5 mg oral tablet  1 tab(s) orally 2 times a day, As Needed - for Pain   novolin 70/30 human recombinant 70 units-30 units/ml subcutaneous suspension  20 unit(s) subcutaneous once a day (in the morning)     Physician's Instructions:  Home Oxygen? Yes   Oxygen delivery at home: 2L  Nasal Cannula   Diet Low Sodium  Carbohydrate Controlled (ADA) Diet   Activity Limitations As tolerated   Return to Work Not Applicable   Time frame for Follow Up Appointment 1-2 weeks  with PMD.   Other Comments routine follow up with PMD.   Electronic Signatures: Vaughan Basta (MD)  (Signed 16-Jul-15 18:12)  Authored: ADMISSION DATE AND DIAGNOSIS, CHIEF COMPLAINT/HPI, Allergies, PERTINENT LABS, PERTINENT RADIOLOGY STUDIES, PERTINENT PAST [place], PATIENT INSTRUCTIONS   Last Updated: 16-Jul-15 18:12 by Vaughan Basta (MD)

## 2015-01-30 NOTE — H&P (Signed)
PATIENT NAME:  Rachel Vazquez, Rachel Vazquez MR#:  [id number] DATE OF BIRTH:  1928/11/05  DATE OF ADMISSION:  10/02/2013  PRIMARY CARE PHYSICIAN:  Dr. Budd Palmer.   REFERRING PHYSICIAN:  Dr. York Cerise.  CHIEF COMPLAINT:  Right hip pain.   HISTORY OF PRESENT ILLNESS:  Rachel Vazquez is an 81 year old, pleasant, white female with a past medical history of coronary artery disease, status post CABG, hypertension, diabetes mellitus, congestive heart failure who is a resident of assisted living facility, was trying to go to the bathroom, as the patient was trying to get out of the bed fell down to the floor.  The patient does not remember the complete events of the fall.  EMS was called and was brought to the Emergency Department.  Work-up showed the patient has right comminuted fracture involving the proximal right femoral diaphysis.  The patient is noted to be bradycardic, in heart rate 40s to 50s while in the Emergency Department.  The patient denies having any chest pain.  The patient had a left heart catheterization done in April 2014 by Dr. Adrian Blackwater, showed patent coronaries.  At that time, the patient was also found to have EF of 30% to 35%.  The patient states experiences some shortness of breath with walking.  However, is able to walk independently around the nursing home without having any chest pain or shortness of breath.  The patient denies history of previous stroke.  Per patient, no work-up was done in regards to coronary artery stenosis.  The patient is not on any beta-blocker.  No records of any echocardiogram.   PAST MEDICAL HISTORY:  1.  Coronary artery disease status post coronary artery bypass grafting.  2.  Diabetes mellitus, insulin-dependent.  3.  Hypertension.  4.  Hyperlipidemia.  5.  Gastroesophageal reflux disease.  6.  Hyperthyroidism.  7.  Depression.  8.  Chronic back and leg pain.   ALLERGIES:  No known drug allergies.   HOME MEDICATIONS: 1.  Synthroid 112 mcg 1 tablet once a day.  2.   NovoLog 70/30 25 units twice daily.  3.  Vitamin B complex two times a day.  4.  Lisinopril 10 mg a day.  5.  Lipitor 40 mg once a day.  6.  Indapamide 1.25 mg tablet once a day.  7.  Celexa 40 mg once a day.  8.  Ambien 5 mg at bedtime.   SOCIAL HISTORY:  No history of smoking, drinking alcohol or using illicit drugs and lives in assisted living facility.   FAMILY HISTORY:  Mother died from the complications of PE, father died from the lung cancer.   REVIEW OF SYSTEMS: CONSTITUTIONAL:  Denies any generalized weakness with no change in vision.  EARS, NOSE, THROAT:  No change in hearing.  RESPIRATORY:  No cough, shortness of breath.  CARDIOVASCULAR:  No chest pain, palpitations.  No pedal edema.   GASTROINTESTINAL:  No nausea, vomiting, abdominal pain.  Usually has good appetite.  GENITOURINARY:  No dysuria or hematuria.  ENDOCRINE:  Diagnosis of hypothyroidism and diabetes mellitus.  HEMATOLOGIC:  No easy bruising or bleeding.  SKIN:  No rash or lesions.  MUSCULOSKELETAL:  Chronic back pain.   NEUROLOGIC:  No weakness or numbness in any part of the body.   PHYSICAL EXAMINATION: GENERAL:  This is a well-built, well-nourished, age-appropriate female lying down in the bed, not in distress.  VITAL SIGNS:  Temperature 98.2, pulse 50, blood pressure 155/51, respiratory rate of 16, oxygen saturation is 92% on 2 liters  of oxygen.  HEENT:  Head normocephalic, atraumatic.  Eyes, no scleral icterus.  Conjunctivae normal.  Pupils equal and react to light.  Extraocular movements are intact.  No pharyngeal erythema.  NECK:  Supple.  No lymphadenopathy.  No JVD.  No carotid bruit.  No thyromegaly.  CHEST:  Has no focal tenderness.  LUNGS:  Bilaterally clear to auscultation. HEART:  S1, S2, irregular, bradycardia.  No pedal edema.  Pulses 2+.  ABDOMEN:  Obese.  Bowel sounds plus.  Soft, nontender, nondistended.  EXTREMITIES:  Right lower extremity is externally rotated.  SKIN:  No rash or lesions.   MUSCULOSKELETAL:  Could not examine secondary to pain in the right leg.   LABORATORY DATA:  Lipase 459.  CMP is completely within normal limits.  Magnesium 1.9.  Troponin less than 0.02.  Urine drug screen is positive for opiates.   CBC:  WBC of 13.9, hemoglobin 9.5, the rest of all the values are within normal limits.   ASSESSMENT AND PLAN:  Rachel Vazquez is an 81 year old female who comes to the Emergency Department after having a fall and is found to have comminuted proximal right femoral fracture. 1.  Right femoral fracture.  Seen by Dr. Hyacinth MeekerMiller.  The patient has a history of sinus bradycardia.  However, cannot confirm if today's event was a syncope versus a fall.  The patient had a recent heart cath done about six months back which was negative for any occlusion.  Has known history of ischemic cardiomyopathy with ejection fraction of 35%.  As per  cardiac index score.  The patient has 1.4% risk of intraoperative cardiac risk, considering the patient's advanced age, diabetes mellitus, previous history of coronary artery disease, ischemic cardiomyopathy.  However, the patient being functional, we will clear for the surgery.  2.  Sinus bradycardia.  The patient follows up with Dr. Adrian BlackwaterShaukat Khan.  We will consult with cardiology for any recommendations about the evaluation.  We will order the echocardiogram and the carotid Dopplers; however, it should not postpone the patient's surgery.  3.  Diabetes mellitus.  We will keep the patient half of the dose of the patient's insulin.  4.  History of hypertension.  We will the lisinopril.  We will continue to follow up.  5.  Keep the patient on deep vein thrombosis prophylaxis with .   TIME SPENT:  50 minutes.    ____________________________ Susa GriffinsPadmaja Graesyn Schreifels, MD pv:ea D: 10/02/2013 04:01:57 ET T: 10/02/2013 04:47:25 ET JOB#: [id number]  cc: Susa GriffinsPadmaja Deah Ottaway, MD, <Dictator> Dr. Ranelle OysterMcGreamaghan Naomy Esham MD ELECTRONICALLY SIGNED 10/17/2013 21:19

## 2015-01-30 NOTE — H&P (Signed)
PATIENT NAME:  Rachel Vazquez, Rachel Vazquez MR#:  [id number] DATE OF BIRTH:  [birth year]  DATE OF ADMISSION:  04/19/2014  PRIMARY CARE PROVIDER: Merlene PullingMary Beth McGranaghan, PA  REFERRING PHYSICIAN: Dr. Carollee MassedKaminski.   CHIEF COMPLAINT: Generalized weakness, cough, shortness of breath.   HISTORY OF PRESENT ILLNESS: This patient is an 81 year old female with past medical history of chronic obstructive pulmonary disease, hypertension, congestive heart failure, who presented to the Emergency Department with complaints of cough, shortness of breath for the last few days. The patient has been gradually getting worse. The patient was admitted recently in June with sepsis from urinary tract infection. The patient states she did well a few days after the discharge. However, the patient has been gradually getting worse, to the point of unable to walk, having cough with mild productive sputum and shortness of breath. Also noticed to have increased swelling in the lower extremities. The patient spends most of the day in a wheelchair. However, she keeps her legs elevated. Concerning this, the patient is brought to the Emergency Department. On work-up in the Emergency Department the patient noted to have mild urinary tract infection. The patient is also found to have decreased oxygen saturations. The patient's BNP is 9000.   PAST MEDICAL HISTORY: 1.  Coronary artery disease, status post coronary artery bypass grafting.  2.  Ischemic cardiomyopathy with ejection fraction of 30% to 35%.  3.  Insulin-dependent diabetes mellitus.  4.  Hypertension.  5.  Hyperlipidemia.  6.  Gastroesophageal reflux disease. 7.  Hypothyroidism.  8.  Depression.  9.  Chronic back and leg pain.  10.  Obesity.    PAST SURGICAL HISTORY: 1.  Cholecystectomy.  2.  Appendectomy.  3.  Coronary artery bypass grafting.   ALLERGIES: No known drug allergies.   HOME MEDICATIONS:  1.  Vitamin D3 1 capsule once a day.  2.  Vitamin C 12,000 units once a day. 3.   Lyrica 75 mg 2 times a day.  4.  Potassium chloride 10 mEq once a day.  5.  Pepcid 20 mg once a day.  6.  Novolin-R 4 times a day.  7.  Insulin 70/30, 20 units in the morning and 10 units in the evening.  8.  Norco 5/325 mg every four hours as needed. 9.  Nexium 40 mg once a day. 10.  Lisinopril 5 mg once a day. 11.  Levothyroxine 112 mcg once a day. 12. every six hours as needed.  13.  Lasix 10 mg once a day.  14.  Symbicort 2 times a day.  15.  Docusate sodium 1 capsule once a day.  16.  Citalopram 40 mg once a day.  17.  Atorvastatin 40 mg once a day.  18.  Ambien 10 mg once a day.    SOCIAL HISTORY: No history of smoking, drinking alcohol or using illicit drugs. Currently lives in an assisted living facility. The patient is a DNR/DNI.   FAMILY HISTORY: Father died of lung cancer.   REVIEW OF SYSTEMS:  CONSTITUTIONAL: Experiencing generalized weakness.  EYES: No change in vision.  ENT: No change in hearing.   RESPIRATORY: Has cough, shortness of breath.  CARDIOVASCULAR: No chest pain or palpations, lower extremity swelling.  GASTROINTESTINAL: No nausea, vomiting, abdominal pain.  GENITOURINARY: No dysuria or hematuria.  HEMATOLOGIC: No easy bruising or bleeding.  SKIN: No rash or lesions.  ENDOCRINE: Has a diagnosis of diabetes mellitus.  HEMATOLOGIC: No easy bruising or bleeding.  NEUROLOGIC: No weakness or numbness in any part  of the body.   PHYSICAL EXAMINATION: GENERAL: This is well-built, well-nourished, age-appropriate female, lying down in the bed, not in distress.  VITAL SIGNS: Temperature 98, pulse 87, blood pressure 124/83, respiratory rate of 18, oxygen saturation is 98% on 2 liters of oxygen.  HEENT: Head normocephalic, atraumatic. There is no scleral icterus. Conjunctivae normal. Pupils equal and react to light. Extraocular movements are intact. Mucous membranes moist. No pharyngeal erythema.  NECK: Supple. No lymphadenopathy. No JVD. No carotid bruit. No  thyromegaly.  CHEST: No focal tenderness. Bilateral diffuse rhonchi.  HEART: S1, S2 regular. No murmurs are heard.  ABDOMEN: Bowel sounds present. Soft, nontender, nondistended. No hepatosplenomegaly.  EXTREMITIES: Has 3 to 4+ pitting edema extending up to the thighs.  SKIN: No rash or lesions.  MUSCULOSKELETAL: Good range of motion in all the extremities.  NEUROLOGIC: The patient is alert, oriented to place, person, and time. Cranial nerves II through XII intact. Motor 5/5 in upper and lower extremities.   LABORATORY DATA: Complete metabolic panel: BUN 25, creatinine of 1.34. the rest of the values are within normal limits. LFTs: Mild elevation of alkaline phosphatase of 228, ALT 172, AST of 136. Troponin 0.14. Lactic acid 1.7. Urinalysis: 1+ leukocyte esterase, WBC of 12. Troponin of 0.18.   ASSESSMENT AND PLAN: The patient is an 81 year old female who comes with cough, shortness of breath and generalized weakness.  1.  Chronic obstructive pulmonary disease exacerbation. Continue with breathing treatments, keep the patient on prednisone 40 mg b.i.d. Later change to once a day. As the patient's breathing improves. We will also keep the patient on levofloxacin, considering the patient's productive cough.  2.  Urinary tract infection. Rocephin should cover. Follow up with urine cultures.  3.  Severe debility. Will involve physical therapy, occupational therapy.  4.  Elevated troponins. The patient has chronically elevated troponins; could be from renal insufficiency.  5.  Hypertension. Continue with the home medications.  6.  Diabetes mellitus. Continue with 70/30.  7.  Keep the patient on deep vein thrombosis prophylaxis with Lovenox.   TIME SPENT: 50 minutes.     ____________________________ Susa Griffins, MD pv:cg D: 04/19/2014 00:12:51 ET T: 04/19/2014 01:29:17 ET JOB#: [id number]  cc: Susa Griffins, MD, <Dictator> [facility] [place], [place] Clerance Lav Theresia Pree MD ELECTRONICALLY  SIGNED 04/22/2014 0:03

## 2015-01-30 NOTE — Discharge Summary (Signed)
PATIENT NAME:  Rachel Vazquez, Rachel Vazquez MR#:  [id number] DATE OF BIRTH:  [birth year]  DATE OF ADMISSION:  10/02/2013 DATE OF DISCHARGE:  10/07/2013   Addendum  I just discussed the patient's anticoagulation issues with Dr. Deeann SaintHoward Miller, and he felt that Lovenox is not indicated for this patient. He recommended aspirin therapy alone. The patient is going to be discharged with no Lovenox therapy to skilled nursing facility; however, she is going to be discharged on aspirin therapy. Enteric-coated aspirin is going to be prescribed for her upon discharge at 325 mg p.o. daily dose.   TIME SPENT: Additional 5 minutes.   ____________________________ Rachel Caperima Earon Rivest, MD rv:lb D: 10/07/2013 12:18:55 ET T: 10/07/2013 12:30:57 ET JOB#: [id number]  cc: Rachel Caperima Caryle Helgeson, MD, <Dictator> Garvis Downum MD ELECTRONICALLY SIGNED 10/14/2013 14:32

## 2015-02-10 IMAGING — CR DG FEMUR 2V*R*
1 series · 4 of 4 positions shown · non-contrast
Comparison: None.

CLINICAL DATA: Status post fall; right lower extremity pain.

EXAM:
RIGHT FEMUR - 2 VIEW

[Series 1: t femur proximal lat right · 0.14mm/px · 4 of 4 slices shown]
[im 1/4]
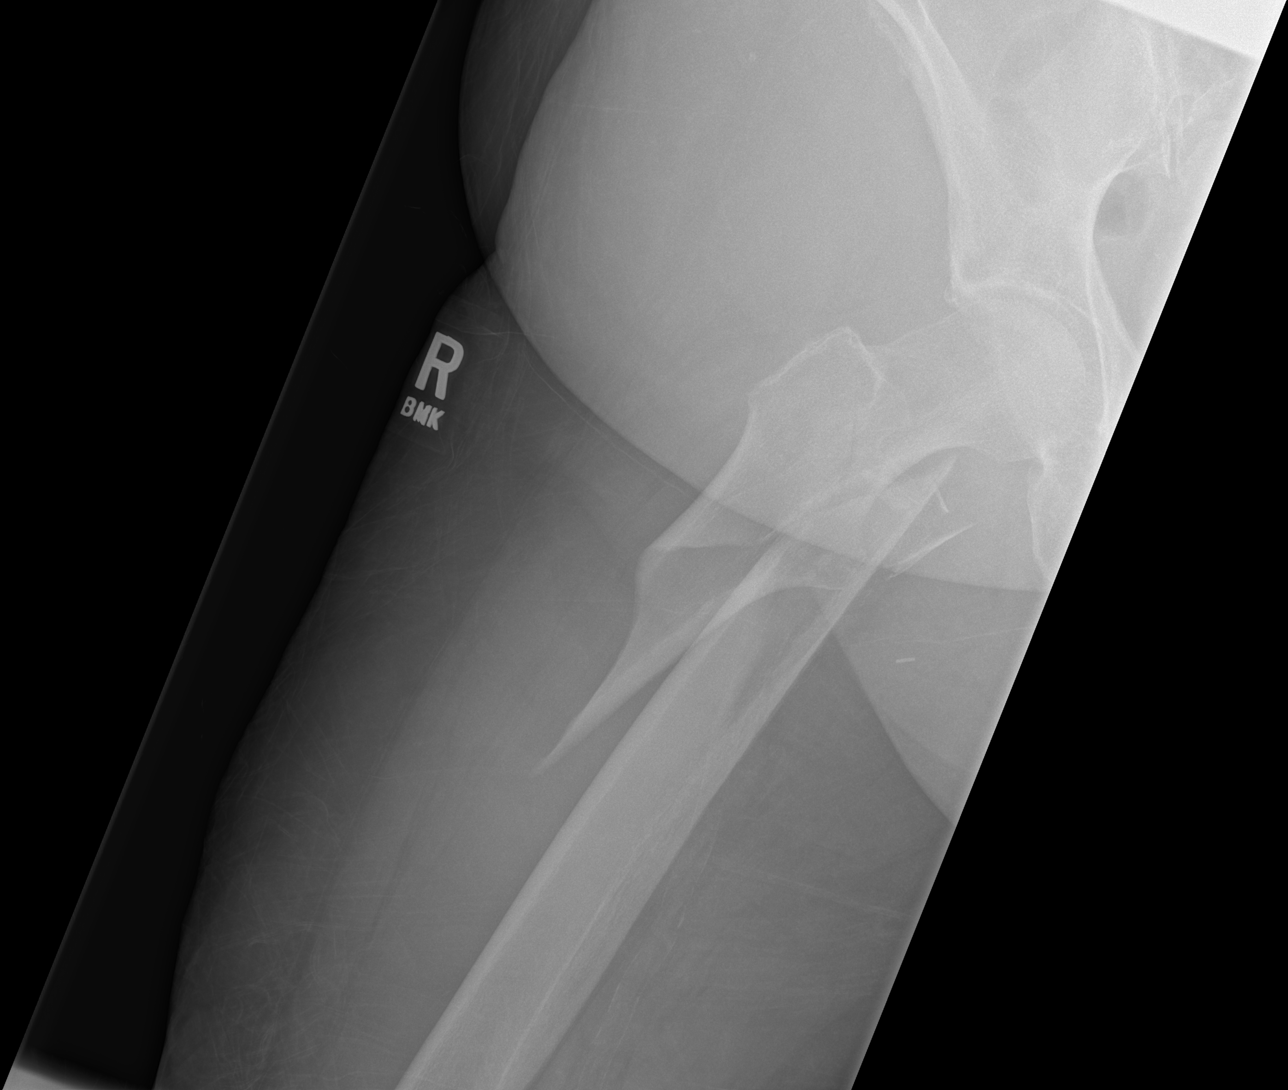
[im 2/4]
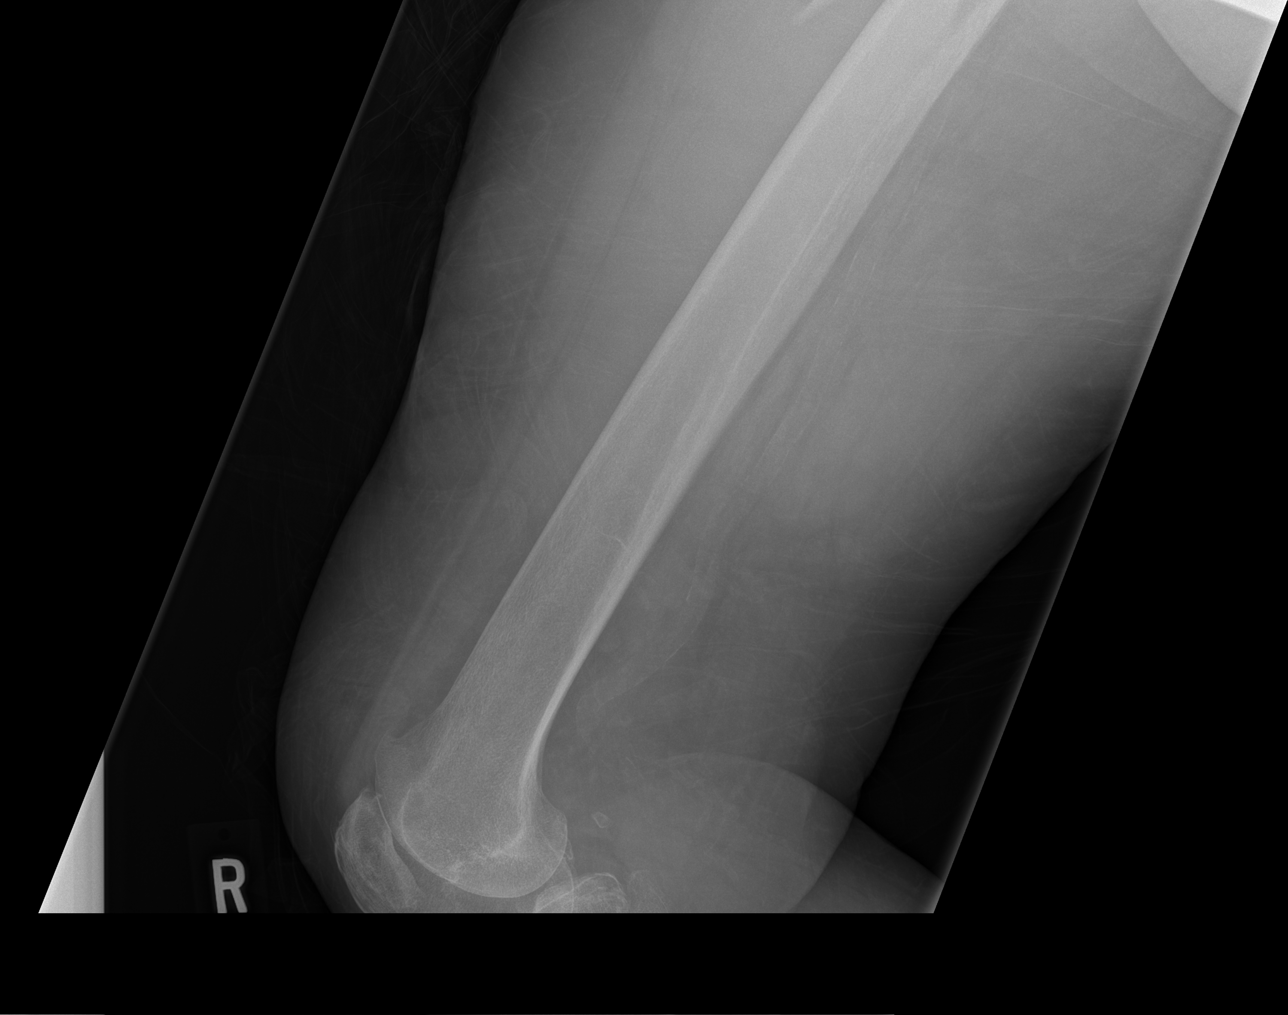
[im 3/4]
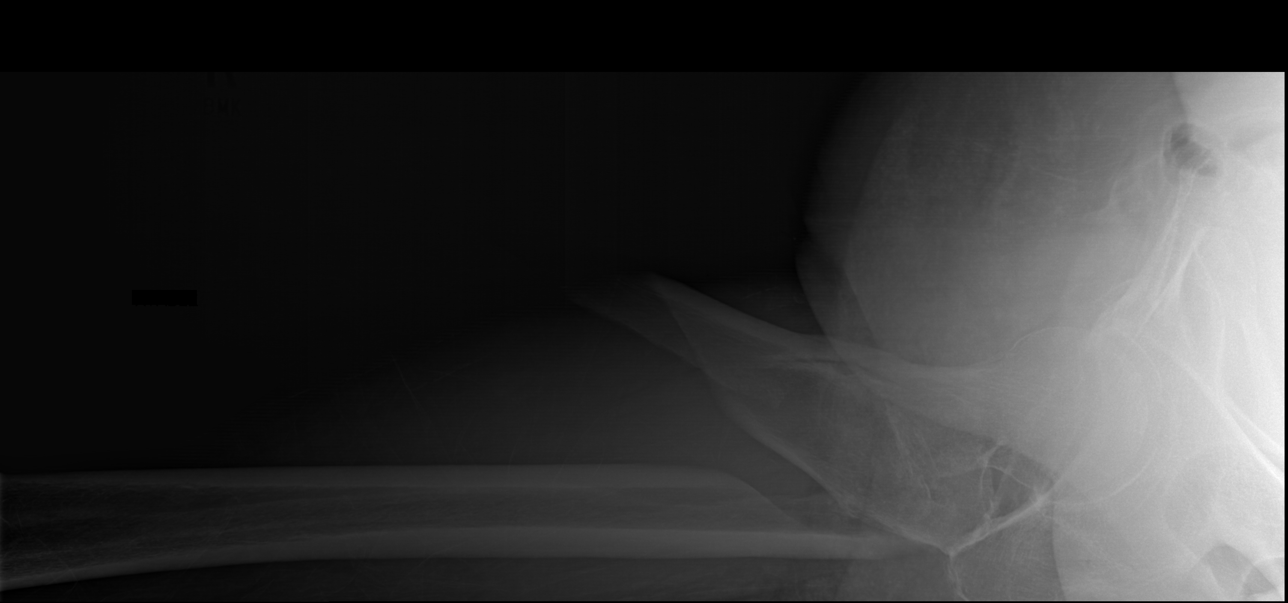
[im 4/4]
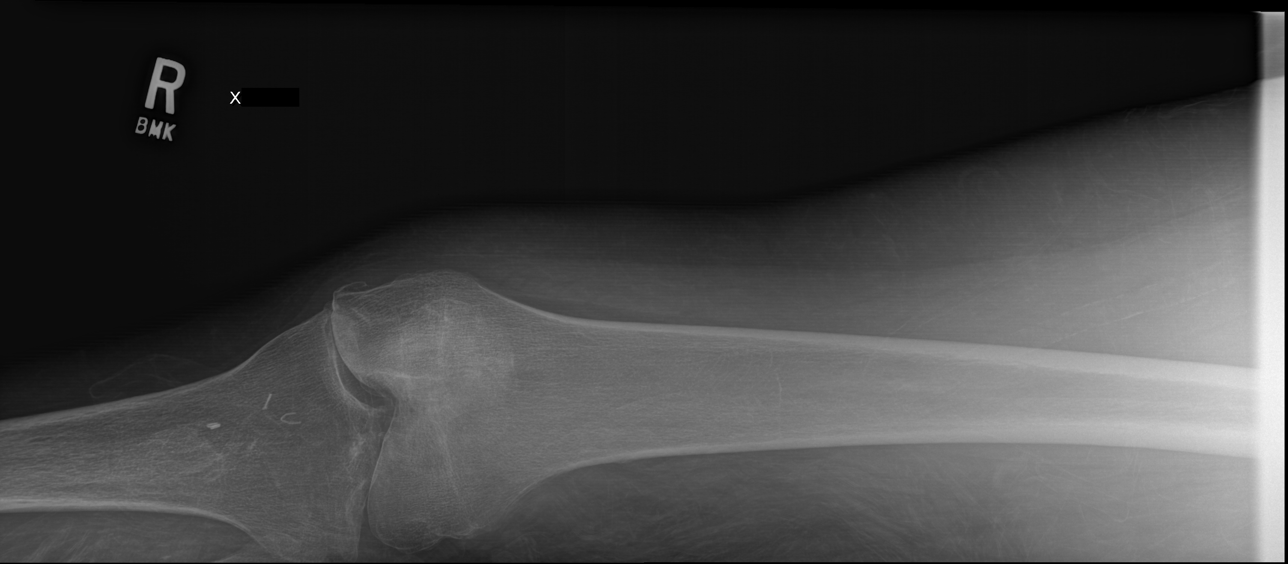

[4 of 4 positions shown; findings below may reference images not displayed]

FINDINGS: A comminuted fracture involving the proximal right femoral diaphysis
is again noted. The lesser femoral trochanter appears intact. An
incomplete fracture line is seen extending toward the greater
femoral trochanter. Scattered smaller cortical fragments are
medially displaced. As noted on the right hip radiographs, there is
approximately 7 cm of medial displacement and shortening at the
fracture site.

No additional fractures are seen. The right femoral head remains
seated at the acetabulum. Mild degenerative change is noted at the
right knee. No knee joint effusion is identified. No significant
soft tissue abnormalities are characterized on radiograph.
IMPRESSION: Comminuted fracture involving the proximal right femoral diaphysis
again noted. The lesser femoral trochanter appears intact, on
assessment of these images and right hip radiographs. Incomplete
fracture line again noted extending toward the right greater femoral
trochanter, with medially displaced smaller cortical fragments. 7 cm
medial displacement and shortening noted at the fracture site.

## 2015-02-10 IMAGING — CR DG FEMUR 2V*R*
1 series · 5 of 5 positions shown · non-contrast
Comparison: Portable exam 2728 hr compared to earlier exam of
10/02/2013

CLINICAL DATA: Postop

EXAM:
RIGHT FEMUR - 2 VIEW

[Series 1: ap · 0.17mm/px · 5 of 5 slices shown]
[im 1/5]
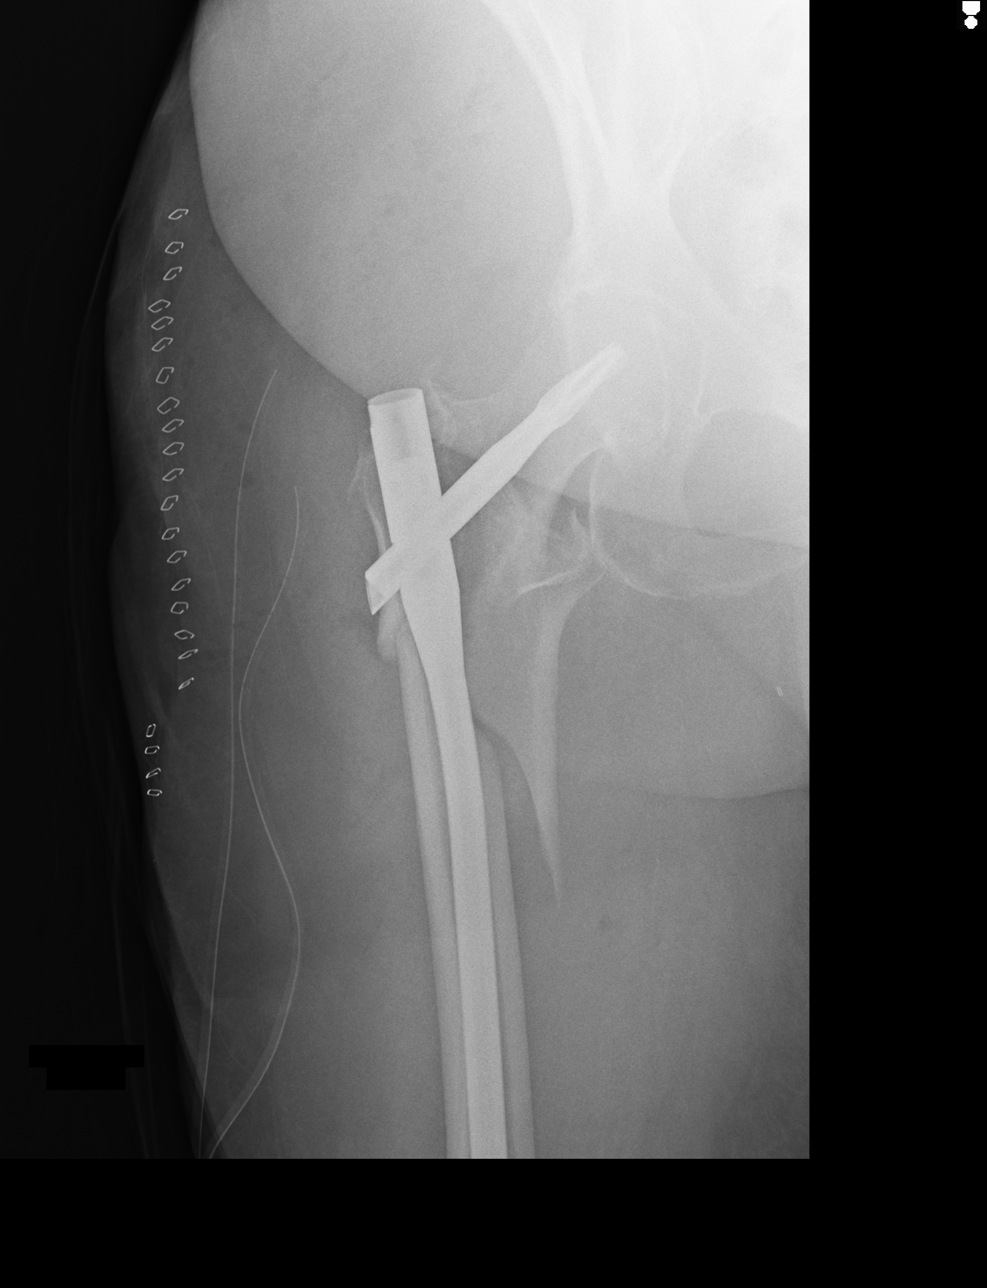
[im 2/5]
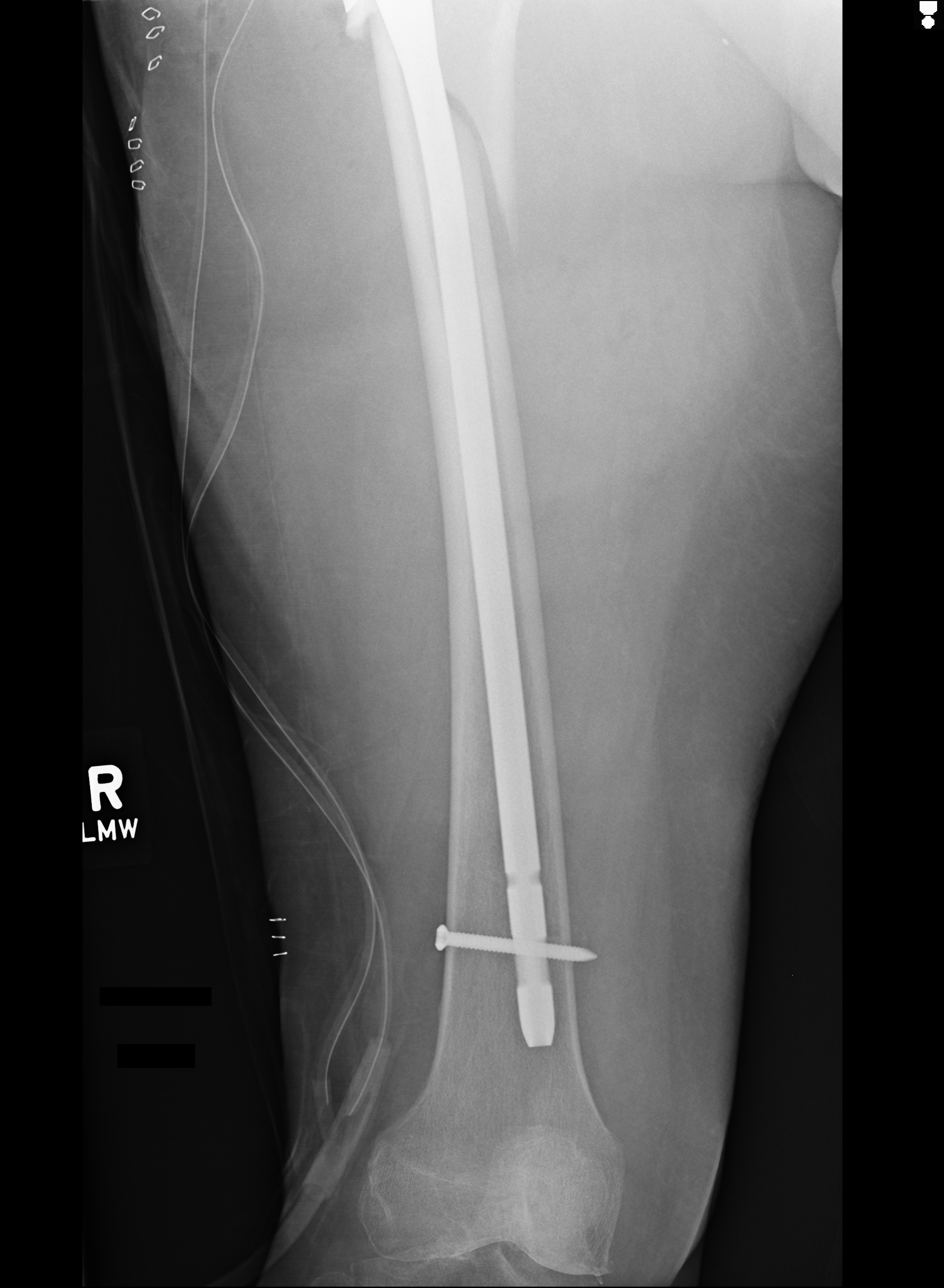
[im 3/5]
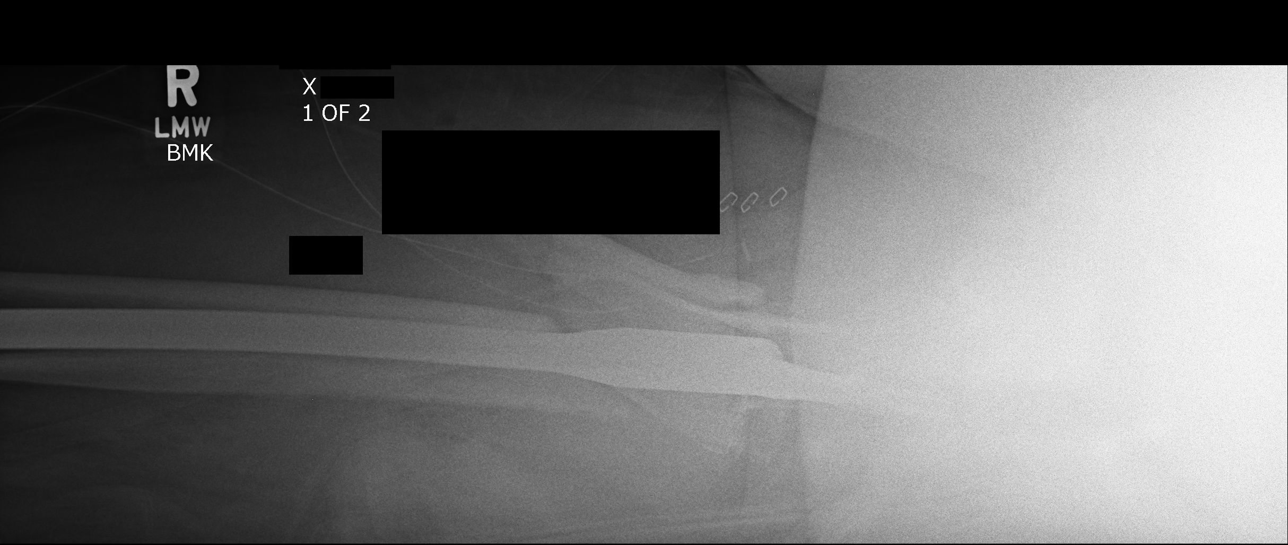
[im 4/5]
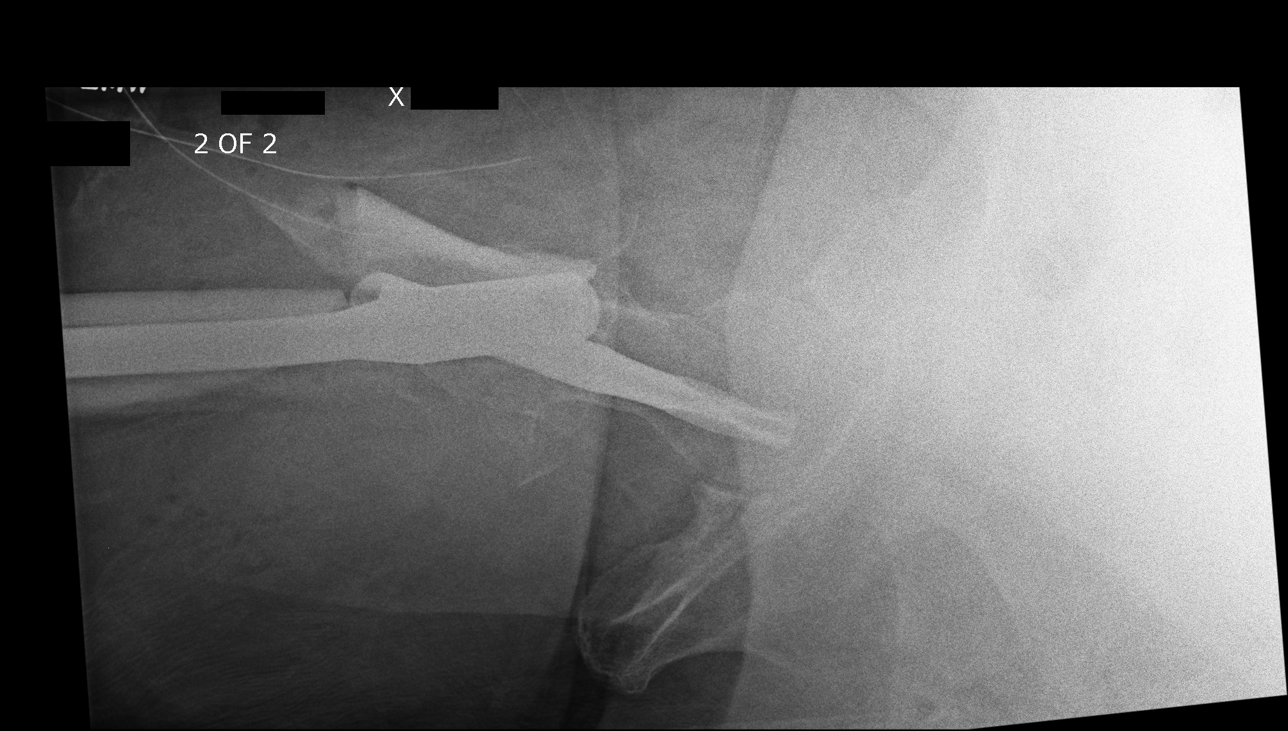
[im 5/5]
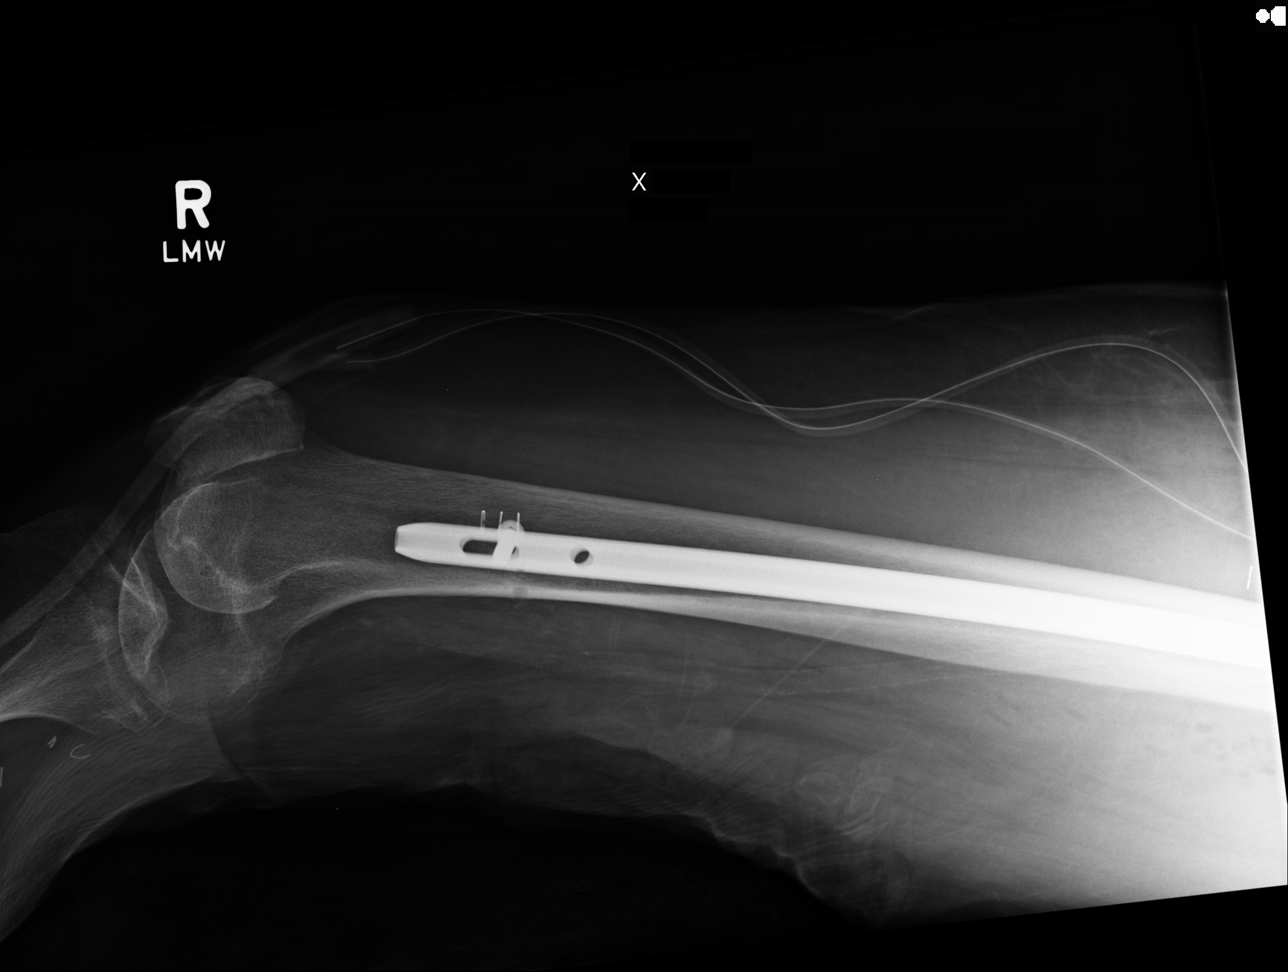

[5 of 5 positions shown; findings below may reference images not displayed]

FINDINGS: Interval placement of an IM nail with a screw at the proximal right
femur post ORIF of a comminuted oblique proximal femoral fracture

Distal locking screw at IM nail.

Hip joint alignment grossly normal.

Surgical drains and skin clips noted.
IMPRESSION: Post ORIF of a comminuted oblique proximal right femoral fracture.

## 2015-02-10 IMAGING — CR RIGHT HIP - COMPLETE 2+ VIEW
1 series · 3 of 3 positions shown · non-contrast
Comparison: None.

CLINICAL DATA: Right leg pain and deformity, status post fall.

EXAM:
RIGHT HIP - COMPLETE 2+ VIEW

[Series 1: t pelvis ap · 0.14mm/px · 3 of 3 slices shown]
[im 1/3]
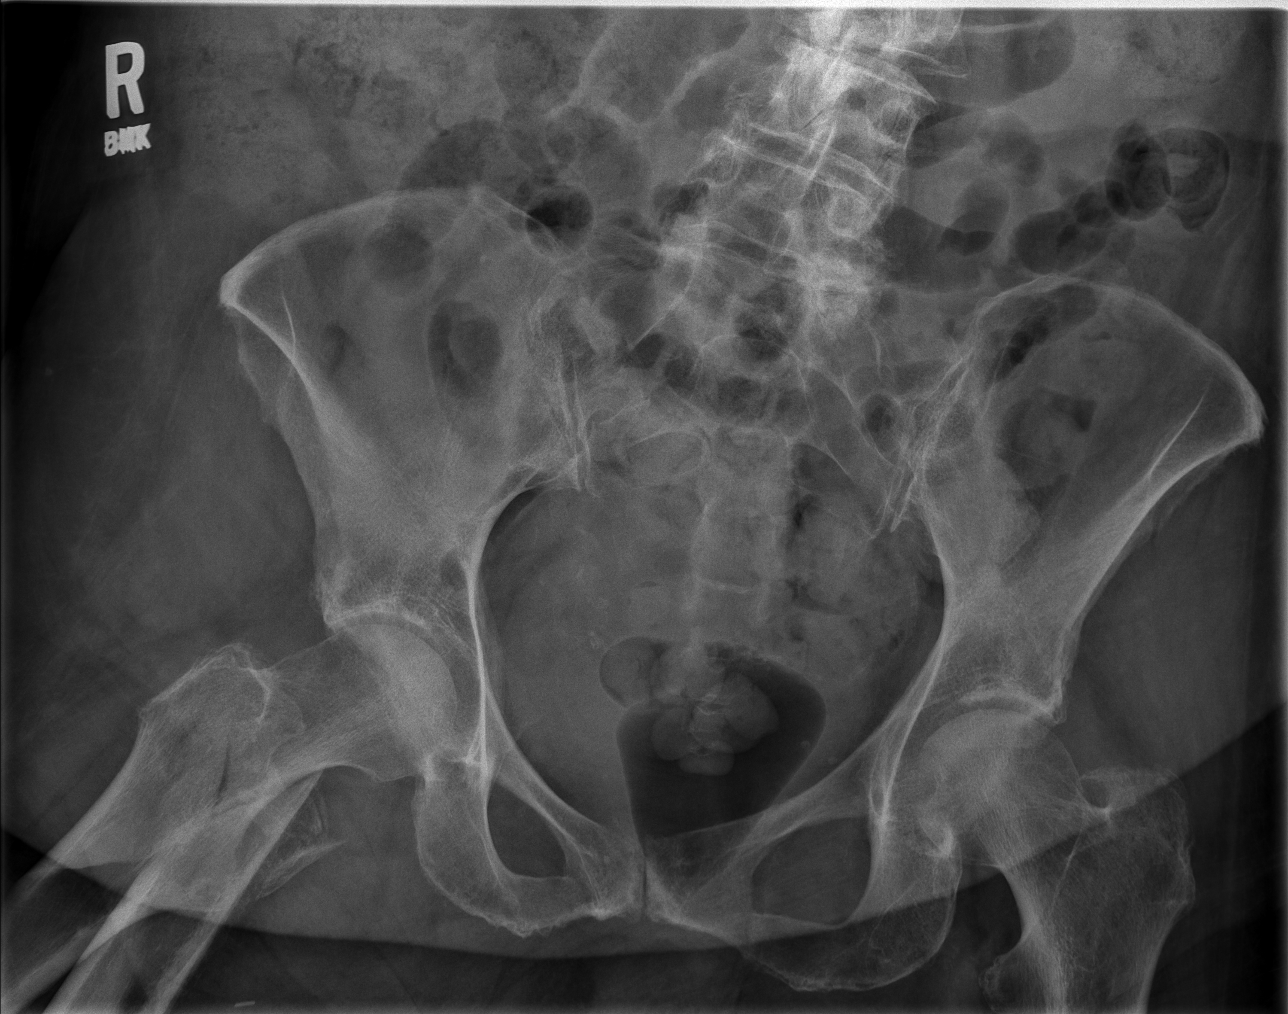
[im 2/3]
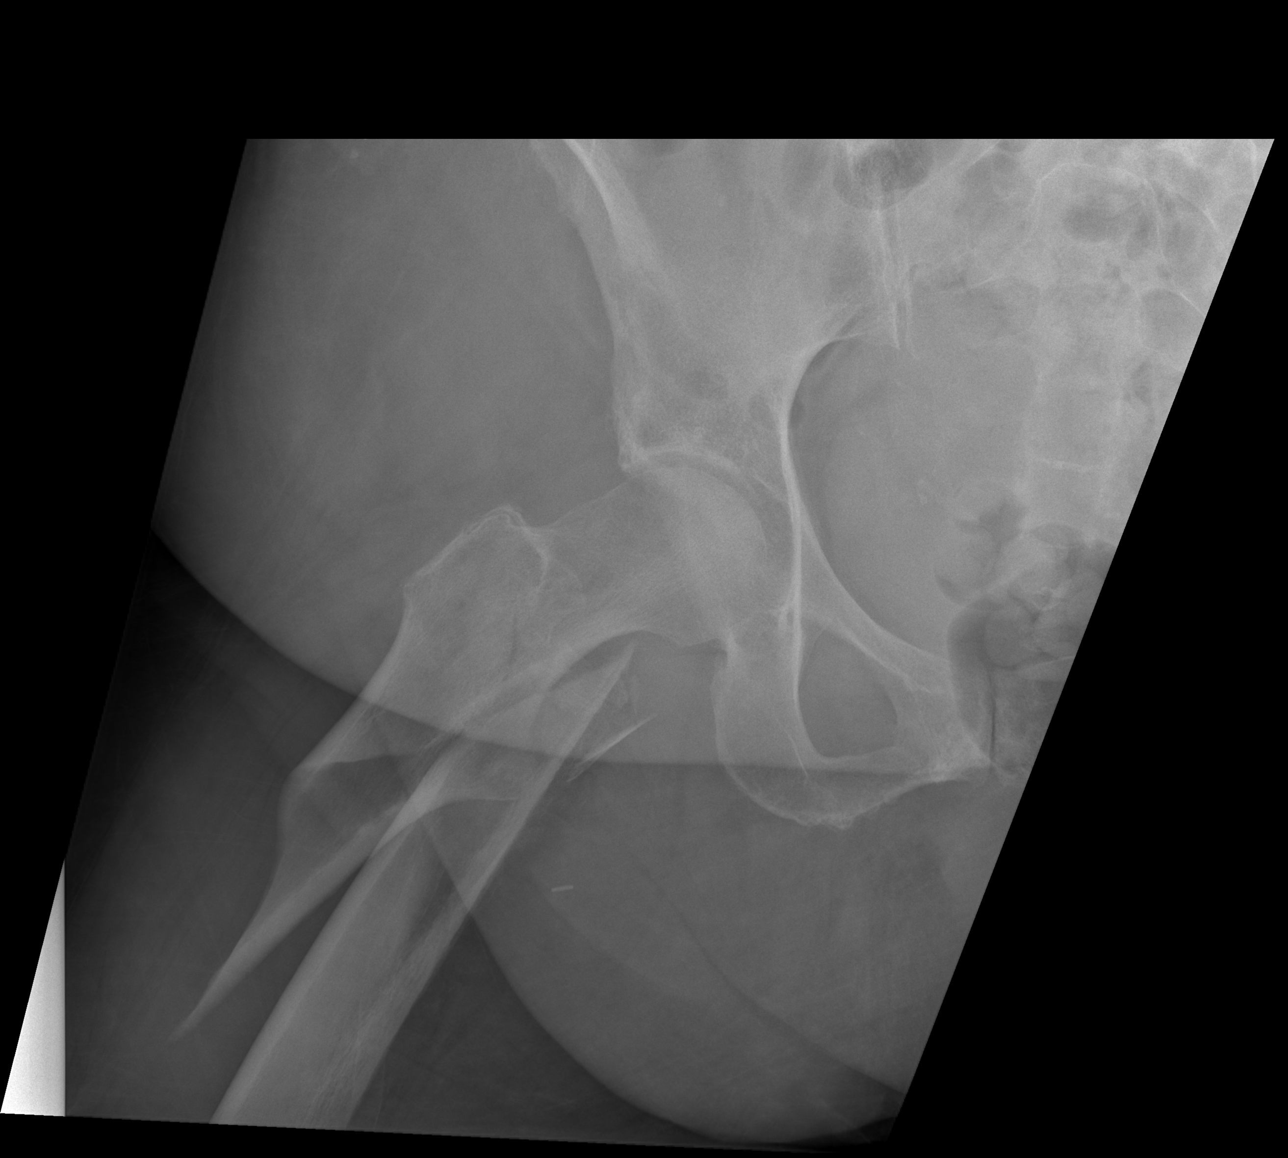
[im 3/3]
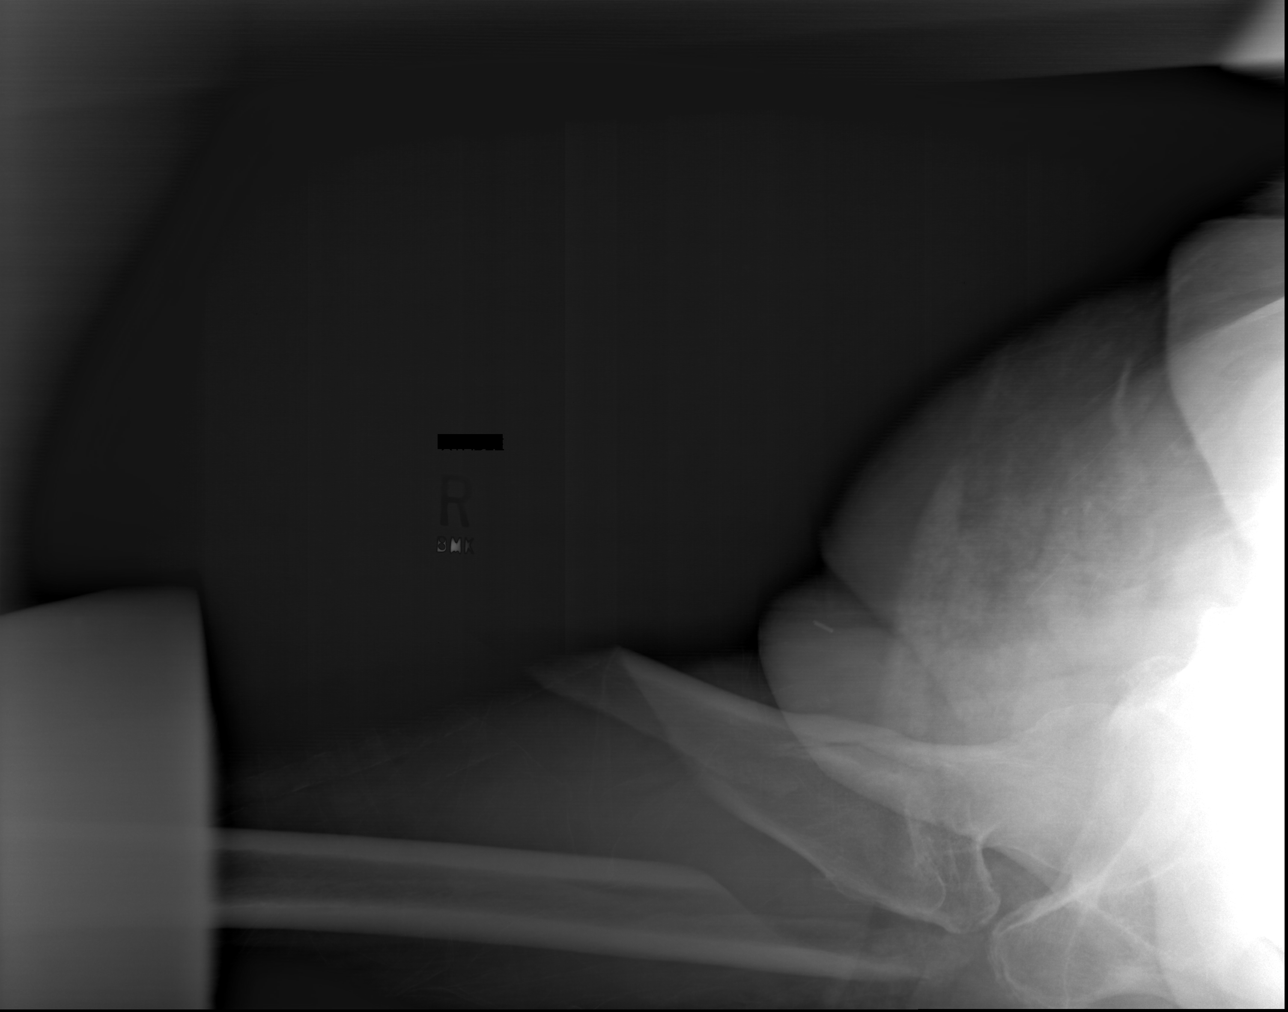

[3 of 3 positions shown; findings below may reference images not displayed]

FINDINGS: There is a comminuted oblique fracture involving the proximal right
femoral diaphysis, just distal to the greater femoral trochanter. An
associated incomplete fracture line is seen extending toward the
greater femoral trochanter, and small lesser trochanteric fragments
are seen. There is approximately 7 cm of shortening and medial
displacement at the fracture site. The right femoral head remains
seated at the acetabulum.

No additional fractures are seen. The right sacroiliac joint is
unremarkable in appearance. The visualized bowel gas pattern is
grossly unremarkable.
IMPRESSION: Comminuted oblique fracture involving the proximal right femoral
diaphysis, just distal to the greater femoral trochanter. Associated
incomplete fracture line extends toward the greater femoral
trochanter, and small lesser trochanteric fragments are seen. 7 cm
of shortening and medial displacement noted at the fracture site.

## 2016-07-21 ENCOUNTER — Other Ambulatory Visit: Payer: Self-pay | Admitting: Internal Medicine

## 2016-07-21 DIAGNOSIS — K449 Diaphragmatic hernia without obstruction or gangrene: Secondary | ICD-10-CM

## 2016-07-22 ENCOUNTER — Emergency Department: Payer: Medicare Other

## 2016-07-22 ENCOUNTER — Emergency Department
Admission: EM | Admit: 2016-07-22 | Discharge: 2016-07-22 | Payer: Medicare Other | Attending: Emergency Medicine | Admitting: Emergency Medicine

## 2016-07-22 DIAGNOSIS — E1165 Type 2 diabetes mellitus with hyperglycemia: Secondary | ICD-10-CM | POA: Insufficient documentation

## 2016-07-22 DIAGNOSIS — Z794 Long term (current) use of insulin: Secondary | ICD-10-CM | POA: Insufficient documentation

## 2016-07-22 DIAGNOSIS — Z79899 Other long term (current) drug therapy: Secondary | ICD-10-CM | POA: Insufficient documentation

## 2016-07-22 DIAGNOSIS — S065X0A Traumatic subdural hemorrhage without loss of consciousness, initial encounter: Secondary | ICD-10-CM | POA: Diagnosis not present

## 2016-07-22 DIAGNOSIS — Y92129 Unspecified place in nursing home as the place of occurrence of the external cause: Secondary | ICD-10-CM | POA: Diagnosis not present

## 2016-07-22 DIAGNOSIS — W19XXXA Unspecified fall, initial encounter: Secondary | ICD-10-CM | POA: Diagnosis not present

## 2016-07-22 DIAGNOSIS — Y999 Unspecified external cause status: Secondary | ICD-10-CM | POA: Insufficient documentation

## 2016-07-22 DIAGNOSIS — R739 Hyperglycemia, unspecified: Secondary | ICD-10-CM

## 2016-07-22 DIAGNOSIS — S065XAA Traumatic subdural hemorrhage with loss of consciousness status unknown, initial encounter: Secondary | ICD-10-CM

## 2016-07-22 DIAGNOSIS — Z7982 Long term (current) use of aspirin: Secondary | ICD-10-CM | POA: Insufficient documentation

## 2016-07-22 DIAGNOSIS — Y939 Activity, unspecified: Secondary | ICD-10-CM | POA: Insufficient documentation

## 2016-07-22 DIAGNOSIS — E039 Hypothyroidism, unspecified: Secondary | ICD-10-CM | POA: Insufficient documentation

## 2016-07-22 DIAGNOSIS — I10 Essential (primary) hypertension: Secondary | ICD-10-CM | POA: Diagnosis not present

## 2016-07-22 DIAGNOSIS — S0990XA Unspecified injury of head, initial encounter: Secondary | ICD-10-CM | POA: Diagnosis present

## 2016-07-22 DIAGNOSIS — S065X9A Traumatic subdural hemorrhage with loss of consciousness of unspecified duration, initial encounter: Secondary | ICD-10-CM

## 2016-07-22 LAB — CBC WITH DIFFERENTIAL/PLATELET
BASOS ABS: 0 10*3/uL (ref 0–0.1)
BASOS PCT: 0 %
EOS ABS: 0 10*3/uL (ref 0–0.7)
Eosinophils Relative: 0 %
HCT: 36.2 % (ref 35.0–47.0)
HEMOGLOBIN: 11.9 g/dL — AB (ref 12.0–16.0)
Lymphocytes Relative: 5 %
Lymphs Abs: 0.7 10*3/uL — ABNORMAL LOW (ref 1.0–3.6)
MCH: 29.6 pg (ref 26.0–34.0)
MCHC: 32.7 g/dL (ref 32.0–36.0)
MCV: 90.3 fL (ref 80.0–100.0)
Monocytes Absolute: 1.6 10*3/uL — ABNORMAL HIGH (ref 0.2–0.9)
Monocytes Relative: 13 %
NEUTROS ABS: 10.3 10*3/uL — AB (ref 1.4–6.5)
Neutrophils Relative %: 82 %
Platelets: 174 10*3/uL (ref 150–440)
RBC: 4.01 MIL/uL (ref 3.80–5.20)
RDW: 13.8 % (ref 11.5–14.5)
WBC: 12.6 10*3/uL — ABNORMAL HIGH (ref 3.6–11.0)

## 2016-07-22 LAB — COMPREHENSIVE METABOLIC PANEL
ALBUMIN: 3.6 g/dL (ref 3.5–5.0)
ALT: 457 U/L — AB (ref 14–54)
AST: 229 U/L — AB (ref 15–41)
Alkaline Phosphatase: 142 U/L — ABNORMAL HIGH (ref 38–126)
Anion gap: 7 (ref 5–15)
BUN: 25 mg/dL — AB (ref 6–20)
CHLORIDE: 88 mmol/L — AB (ref 101–111)
CO2: 37 mmol/L — ABNORMAL HIGH (ref 22–32)
CREATININE: 1.09 mg/dL — AB (ref 0.44–1.00)
Calcium: 9.3 mg/dL (ref 8.9–10.3)
GFR calc Af Amer: 52 mL/min — ABNORMAL LOW (ref >60)
GFR, EST NON AFRICAN AMERICAN: 45 mL/min — AB (ref >60)
GLUCOSE: 318 mg/dL — AB (ref 65–99)
Potassium: 3.8 mmol/L (ref 3.5–5.1)
Sodium: 132 mmol/L — ABNORMAL LOW (ref 135–145)
Total Bilirubin: 1 mg/dL (ref 0.3–1.2)
Total Protein: 7.4 g/dL (ref 6.5–8.1)

## 2016-07-22 LAB — URINALYSIS COMPLETE WITH MICROSCOPIC (ARMC ONLY)
BILIRUBIN URINE: NEGATIVE
Bacteria, UA: NONE SEEN
Glucose, UA: 150 mg/dL — AB
Hgb urine dipstick: NEGATIVE
KETONES UR: NEGATIVE mg/dL
LEUKOCYTES UA: NEGATIVE
NITRITE: NEGATIVE
PH: 5 (ref 5.0–8.0)
Protein, ur: 30 mg/dL — AB
SPECIFIC GRAVITY, URINE: 1.015 (ref 1.005–1.030)
Squamous Epithelial / LPF: NONE SEEN

## 2016-07-22 LAB — BLOOD GAS, VENOUS
Acid-Base Excess: 15.1 mmol/L — ABNORMAL HIGH (ref 0.0–2.0)
BICARBONATE: 41.8 mmol/L — AB (ref 20.0–28.0)
O2 Saturation: 87.4 %
PH VEN: 7.43 (ref 7.250–7.430)
PO2 VEN: 52 mmHg — AB (ref 32.0–45.0)
Patient temperature: 37
pCO2, Ven: 63 mmHg — ABNORMAL HIGH (ref 44.0–60.0)

## 2016-07-22 LAB — TROPONIN I: TROPONIN I: 0.08 ng/mL — AB (ref <0.03)

## 2016-07-22 LAB — PROTIME-INR
INR: 1.42
Prothrombin Time: 17.5 seconds — ABNORMAL HIGH (ref 11.4–15.2)

## 2016-07-22 LAB — APTT: APTT: 34 s (ref 24–36)

## 2016-07-22 MED ORDER — SODIUM CHLORIDE 0.9 % IV SOLN: Freq: Once | INTRAVENOUS | Status: DC

## 2016-07-22 MED ORDER — SODIUM CHLORIDE 0.9 % IV BOLUS (SEPSIS): 1000.0000 mL | Freq: Once | INTRAVENOUS | Status: DC

## 2016-07-22 MED ORDER — LABETALOL HCL 5 MG/ML IV SOLN
10.0000 mg | Freq: Once | INTRAVENOUS | Status: AC
  Administered 2016-07-22: 10 mg via INTRAVENOUS
  Filled 2016-07-22: qty 4

## 2016-07-22 MED ORDER — LORAZEPAM 2 MG/ML IJ SOLN
0.5000 mg | Freq: Once | INTRAMUSCULAR | Status: AC
  Administered 2016-07-22: 0.5 mg via INTRAVENOUS

## 2016-07-22 MED ORDER — LORAZEPAM 2 MG/ML IJ SOLN
INTRAMUSCULAR | Status: AC
  Administered 2016-07-22: 0.5 mg via INTRAVENOUS
  Filled 2016-07-22: qty 1

## 2016-07-22 NOTE — ED Provider Notes (Signed)
[facility] Emergency Department Provider Note  ____________________________________________  Time seen: Approximately 3:50 PM  I have reviewed the triage vital signs and the nursing notes.   HISTORY  Chief Complaint Posterior head pain  Level 5 caveat:  Portions of the history and physical were unable to be obtained due to the patient's poor historian    HPI Rachel Vazquez is a 82 y.o. female brought to the ED for an unwitnessed fall from her nursing home. Patient complains of pain in the back of her head. She denies loss of consciousness.  Patient reports being in her usual state of health this morning other than the productive cough that she's had for a week, being treated for coming acquired pneumonia with Levaquin for the past few days. She ate a large breakfast this morning. She states that she has not eaten lunch yet because it's only 11 AM currently (it's almost 4pm).She denies any other complaints.     No past medical history on file. 1. Diabetes mellitus type 2. 2. Gastroesophageal reflux disease. 3. Hypertension. 4. Dyslipidemia. 5. Chronic insomnia. 6. History of anxiety neurosis and depression status post suicide attempt in June 2010 and hospitalized. 7. Hypothyroidism. 8. Coronary heart disease status post bypass surgery x3 in October 2007. 9. Hiatal hernia with Schatzki's ring  10. Degenerative joint disease of knees and a history of left ankle fracture with chronic pain from that       There are no active problems to display for this patient.    No past surgical history on file. CABG 2007  Prior to Admission medications   Medication Sig Start Date End Date Taking? Authorizing Provider  aspirin EC 81 MG tablet Take 81 mg by mouth daily.   Yes Historical Provider, MD  calcium-vitamin D (OSCAL-500) 500-400 MG-UNIT tablet Take 1 tablet by mouth 2 (two) times daily.   Yes Historical Provider, MD  cholecalciferol (VITAMIN D) 1000 units  tablet Take 2,000 Units by mouth daily.   Yes Historical Provider, MD  citalopram (CELEXA) 40 MG tablet Take 40 mg by mouth daily.   Yes Historical Provider, MD  famotidine (PEPCID) 20 MG tablet Take 20 mg by mouth daily.   Yes Historical Provider, MD  Fluticasone-Salmeterol (ADVAIR) 250-50 MCG/DOSE AEPB Inhale 1 puff into the lungs 2 (two) times daily.   Yes Historical Provider, MD  furosemide (LASIX) 20 MG tablet Take 40 mg by mouth 2 (two) times daily.   Yes Historical Provider, MD  hydrocortisone cream 1 % Apply 1 application topically 2 (two) times daily as needed for itching.   Yes Historical Provider, MD  insulin NPH-regular Human (NOVOLIN 70/30) (70-30) 100 UNIT/ML injection Inject 11 Units into the skin every morning.   Yes Historical Provider, MD  insulin regular (NOVOLIN R,HUMULIN R) 100 units/mL injection Inject 2-5 Units into the skin 3 (three) times daily before meals. Per sliding scale:  180-240=2 units 241-350=4 units 351-400=5 units >400=contact MD   Yes Historical Provider, MD  ipratropium-albuterol (DUONEB) 0.5-2.5 (3) MG/3ML SOLN Take 3 mLs by nebulization 2 (two) times daily as needed.   Yes Historical Provider, MD  levothyroxine (SYNTHROID, LEVOTHROID) 112 MCG tablet Take 112 mcg by mouth daily.   Yes Historical Provider, MD  lisinopril (PRINIVIL,ZESTRIL) 2.5 MG tablet Take 2.5 mg by mouth daily.   Yes Historical Provider, MD  Melatonin 3 MG TABS Take 1 tablet by mouth at bedtime.   Yes Historical Provider, MD  nystatin cream (MYCOSTATIN) Apply 1 application topically 2 (two)  times daily as needed for dry skin.   Yes Historical Provider, MD  oxyCODONE (OXY IR/ROXICODONE) 5 MG immediate release tablet Take 5 mg by mouth every morning.   Yes Historical Provider, MD  oxyCODONE (ROXICODONE) 5 MG immediate release tablet Take 7.5 mg by mouth every 4 (four) hours as needed for severe pain.   Yes Historical Provider, MD  polyethylene glycol (MIRALAX / GLYCOLAX) packet Take 17 g by  mouth daily.   Yes Historical Provider, MD  polyvinyl alcohol (LIQUIFILM TEARS) 1.4 % ophthalmic solution Place 1 drop into both eyes 2 (two) times daily as needed for dry eyes.   Yes Historical Provider, MD  potassium citrate (UROCIT-K) 10 MEQ (1080 MG) SR tablet Take 10 mEq by mouth 3 (three) times daily with meals.   Yes Historical Provider, MD  pregabalin (LYRICA) 100 MG capsule Take 100 mg by mouth daily.   Yes Historical Provider, MD  pregabalin (LYRICA) 75 MG capsule Take 75 mg by mouth daily.   Yes Historical Provider, MD  zolpidem (AMBIEN) 5 MG tablet Take 2.5 mg by mouth at bedtime as needed for sleep.   Yes Historical Provider, MD  aspirin oral tablet 81 mg 1 tab by mouth daily. Patient Response: taking (One Touch Ultra Test) strip  EVERY HS. Patient Response: taking citalopram hydrobromide oral tablet 40 mg 1 tab by mouth daily. Patient Response: taking fluticasone propionate/salmeterol xinafoate inhalation (Advair Diskus) disk,with inhalation device 100-4050mcg 1 puff by inhalation daily. Patient Response: taking levothyroxine sodium oral (Synthroid) tablet 125 mcg 1 tab by mouth daily. Patient Response: taking metoprolol succinate oral tablet,sustained release 24hr 25 mg 1 tab by mouth daily. Patient Response: taking nph, human insulin isophane/insulin regular, human subcutaneous (Humulin 70/30) vial (sdv,mdv or additive) 70-30/ml 16 units AM,10 units PM. Patient Response: taking pantoprazole sodium oral tablet,delayed release(ec) 40 mg 1 tab by mouth daily. Patient Response: taking potassium chloride oral tablet,sr particle/crystals 20 meq 1 tab by mouth daily. Patient Response: taking simvastatin oral tablet 20 mg 1 tab by mouth daily. Patient Response: taking syringe with needle,disposable,insulin 1 ml misc.(non-drug; combo route) (BD Insulin Syringe Micro-Fine) syringe,empty disposable 28gx1/2" Patient Response: taking trazodone hcl oral tablet 50 mg 1 tab by  mouth at bedtime. Patient Response: taking       Allergies Review of patient's allergies indicates no known allergies. Sulfonamides, reaction unknown. Erythromycin, reaction unknown Lodine, reaction unknown  No family history on file.  Social History Social History  Substance Use Topics  . Smoking status: Not on file  . Smokeless tobacco: Not on file  . Alcohol use Not on file  no tobacco alcohol or drug use  Review of Systems  Constitutional:   No fever or chills.  ENT:   No sore throat. No rhinorrhea. Cardiovascular:   No chest pain. Respiratory:   Positive productive cough. Gastrointestinal:   Negative for abdominal pain, vomiting and diarrhea.  Genitourinary:   Negative for dysuria or difficulty urinating. Musculoskeletal:   Negative for focal pain or swelling Neurological:   Positive posterior headache after fall 10-point ROS otherwise negative.  ____________________________________________   PHYSICAL EXAM:  VITAL SIGNS: ED Triage Vitals  Enc Vitals Group     BP      Pulse      Resp      Temp      Temp src      SpO2      Weight      Height      Head Circumference  Peak Flow      Pain Score      Pain Loc      Pain Edu?      Excl. in GC?     Vital signs reviewed, nursing assessments reviewed.   Constitutional:   Alert and orientedTo person and place. Well appearing and in no distress. Eyes:   No scleral icterus. No conjunctival pallor. PERRL. EOMI.  No nystagmus. ENT   Head:   Normocephalic and atraumatic.No evidence of abrasion contusion or hematoma. No laceration.   Nose:   No congestion/rhinnorhea. No septal hematoma   Mouth/Throat:   MMM, no pharyngeal erythema. No peritonsillar mass.    Neck:   No stridor. No SubQ emphysema. No meningismus. Hematological/Lymphatic/Immunilogical:   No cervical lymphadenopathy. Cardiovascular:   Tachycardia heart rate 110. Symmetric bilateral radial and DP pulses.  No murmurs.  Respiratory:    Normal respiratory effort without tachypnea nor retractions. Coarse breath sounds diffusely  Gastrointestinal:   Soft and nontender. Non distended. There is no CVA tenderness.  No rebound, rigidity, or guarding. Genitourinary:   deferred Musculoskeletal:   Right hip tenderness with crunchy sensation on palpation. Right leg is shortened compared to the left Neurologic:   Normal speech and language.  CN 2-10 normal. Motor grossly intact. No gross focal neurologic deficits are appreciated.  Skin:    Skin is warm, dry and intact. No rash noted.  No petechiae, purpura, or bullae.  ____________________________________________    LABS (pertinent positives/negatives) (all labs ordered are listed, but only abnormal results are displayed) Labs Reviewed  COMPREHENSIVE METABOLIC PANEL - Abnormal; Notable for the following:       Result Value   Sodium 132 (*)    Chloride 88 (*)    CO2 37 (*)    Glucose, Bld 318 (*)    BUN 25 (*)    Creatinine, Ser 1.09 (*)    AST 229 (*)    ALT 457 (*)    Alkaline Phosphatase 142 (*)    GFR calc non Af Amer 45 (*)    GFR calc Af Amer 52 (*)    All other components within normal limits  TROPONIN I - Abnormal; Notable for the following:    Troponin I 0.08 (*)    All other components within normal limits  CBC WITH DIFFERENTIAL/PLATELET - Abnormal; Notable for the following:    WBC 12.6 (*)    Hemoglobin 11.9 (*)    Neutro Abs 10.3 (*)    Lymphs Abs 0.7 (*)    Monocytes Absolute 1.6 (*)    All other components within normal limits  URINE CULTURE  URINALYSIS COMPLETEWITH MICROSCOPIC ([facility] ONLY)  PROTIME-INR  APTT  TYPE AND SCREEN   ____________________________________________   EKG  Interpreted by me Atrial fibrillation rate of 99, right axis, normal intervals. Normal QRS ST segments and T waves.  ____________________________________________    RADIOLOGY  Chest x-ray unremarkable X-ray right hip shows no obvious fracture notes difficult  to ascertain with a chronically deformed bony anatomy and fixation hardware in place. CT head shows a large right subdural hematoma with midline shift. CT cervical spine unremarkable  ____________________________________________   PROCEDURES Procedures CRITICAL CARE Performed by: Scotty Court, Tacoya Altizer   Total critical care time: 45 minutes  Critical care time was exclusive of separately billable procedures and treating other patients.  Critical care was necessary to treat or prevent imminent or life-threatening deterioration.  Critical care was time spent personally by me on the following activities: development of treatment  plan with patient and/or surrogate as well as nursing, discussions with consultants, evaluation of patient's response to treatment, examination of patient, obtaining history from patient or surrogate, ordering and performing treatments and interventions, ordering and review of laboratory studies, ordering and review of radiographic studies, pulse oximetry and re-evaluation of patient's condition.  ____________________________________________   INITIAL IMPRESSION / ASSESSMENT AND PLAN / ED COURSE  Pertinent labs & imaging results that were available during my care of the patient were reviewed by me and considered in my medical decision making (see chart for details).  Patient presents after unwitnessed fall. Has hyperglycemia and tachycardia, we'll give IV fluids. High suspicion for a hip fracture, we'll check x-rays. Given complete lack of evidence of head trauma on exam, and absence of anticoagulant use, CT of the head does not appear warranted at this time.     Clinical Course  Value Comment By Time  Troponin I: (!!) 0.08 Continue monitoring, patient now acutely altered mental status, we'll get CT head. Sharman Cheek, MD 10/14 1647   Chest x-ray unremarkable. Hip x-ray does not reveal any obvious fracture. The distal interlocking screw of the prosthesis in  the distal femur is broken.  CT images reviewed, showed a large subdural on the right with midline shift which explains her change in mental status. Discussed with family, they affirm her DO NOT RESUSCITATE status but do feel that if she has a treatable condition she did want to proceed with intervention as necessary. They have a strong preference for [facility] for receiving center. I will attempt to initiate transfer.  Family also note that when they first encountered the patient at her nursing home today, she had some perioral cyanosis. They noted that she was using portable oxygen and that the tank was empty, instead of the reliable fixed oxygen source that she normally uses. I think this may have been the cause for her fall. Sharman Cheek, MD 10/14 1719   D/w radiology, confirms findings, likely all acute. Awaiting callback from [facility] after talking with transfer center. Sharman Cheek, MD 10/14 1730   Discussed with Dr. Threasa Beards of neuro critical care at [facility]. Accepts  to the neuro ICU for neurosurgical evaluation. Requested critical care transport from [facility], air if possible. Dr. Threasa Beards recommends goal systolic blood pressure less than 150. Will get intermittent IV labetalol as needed for control of blood pressure. Sharman Cheek, MD 10/14 [phone]   Just notified from [facility] transfer center that they're actually on ICU diversion and unable to accept the patient after all. We'll contact [facility]. Sharman Cheek, MD 10/14 1747   Awake, alert, follows commands, confused. Neuro exam non-focal Sharman Cheek, MD 10/14 [phone]   Discussed with [facility] neurosurgery Dr. Samuella Cota. Accepts  to neuro ICU. Requested air transport from [facility] care. Sharman Cheek, MD 10/14 1850   ____________________________________________   FINAL CLINICAL IMPRESSION(S) / ED DIAGNOSES  Final diagnoses:  Hyperglycemia  Subdural hematoma (HCC)       Portions of this note were generated with dragon dictation software.  Dictation errors may occur despite best attempts at proofreading.    Sharman Cheek, MD 07/22/16 [phone]

## 2016-07-22 NOTE — ED Triage Notes (Signed)
[facility] complaint of posterior head pain S/P unwitnessed fall. Patient has no recollection of event. Wound noted to back of head. Patient alert and oriented per baseline

## 2016-07-22 NOTE — ED Notes (Signed)
Patient sustained acute LOC change. MD aware.

## 2016-07-24 LAB — URINE CULTURE: Culture: NO GROWTH

## 2016-07-26 ENCOUNTER — Encounter
Admission: RE | Admit: 2016-07-26 | Discharge: 2016-07-26 | Disposition: A | Discharge status: Home / Self Care | Payer: Medicare Other | Source: Ambulatory Visit | Attending: Internal Medicine | Admitting: Internal Medicine

## 2016-07-26 LAB — GLUCOSE, CAPILLARY
GLUCOSE-CAPILLARY: 145 mg/dL — AB (ref 65–99)
Glucose-Capillary: 189 mg/dL — ABNORMAL HIGH (ref 65–99)

## 2016-07-27 LAB — GLUCOSE, CAPILLARY
GLUCOSE-CAPILLARY: 190 mg/dL — AB (ref 65–99)
GLUCOSE-CAPILLARY: 302 mg/dL — AB (ref 65–99)
Glucose-Capillary: 336 mg/dL — ABNORMAL HIGH (ref 65–99)
Glucose-Capillary: 329 mg/dL — ABNORMAL HIGH (ref 65–99)

## 2016-07-28 ENCOUNTER — Ambulatory Visit: Admission: RE | Admit: 2016-07-28 | Payer: Medicare Other | Source: Ambulatory Visit

## 2016-07-30 LAB — GLUCOSE, CAPILLARY
GLUCOSE-CAPILLARY: 450 mg/dL — AB (ref 65–99)
GLUCOSE-CAPILLARY: 329 mg/dL — AB (ref 65–99)
GLUCOSE-CAPILLARY: 144 mg/dL — AB (ref 65–99)
GLUCOSE-CAPILLARY: 108 mg/dL — AB (ref 65–99)
Glucose-Capillary: 149 mg/dL — ABNORMAL HIGH (ref 65–99)
Glucose-Capillary: 302 mg/dL — ABNORMAL HIGH (ref 65–99)
Glucose-Capillary: 178 mg/dL — ABNORMAL HIGH (ref 65–99)

## 2016-07-31 LAB — GLUCOSE, CAPILLARY
GLUCOSE-CAPILLARY: 94 mg/dL (ref 65–99)
GLUCOSE-CAPILLARY: 236 mg/dL — AB (ref 65–99)
Glucose-Capillary: 182 mg/dL — ABNORMAL HIGH (ref 65–99)
Glucose-Capillary: 184 mg/dL — ABNORMAL HIGH (ref 65–99)
Glucose-Capillary: 149 mg/dL — ABNORMAL HIGH (ref 65–99)
Glucose-Capillary: 195 mg/dL — ABNORMAL HIGH (ref 65–99)
Glucose-Capillary: 212 mg/dL — ABNORMAL HIGH (ref 65–99)

## 2016-08-01 LAB — GLUCOSE, CAPILLARY
GLUCOSE-CAPILLARY: 255 mg/dL — AB (ref 65–99)
Glucose-Capillary: 168 mg/dL — ABNORMAL HIGH (ref 65–99)
Glucose-Capillary: 269 mg/dL — ABNORMAL HIGH (ref 65–99)
Glucose-Capillary: 220 mg/dL — ABNORMAL HIGH (ref 65–99)

## 2016-08-02 ENCOUNTER — Non-Acute Institutional Stay (SKILLED_NURSING_FACILITY): Payer: Medicare Other | Admitting: Gerontology

## 2016-08-02 DIAGNOSIS — E1140 Type 2 diabetes mellitus with diabetic neuropathy, unspecified: Secondary | ICD-10-CM | POA: Insufficient documentation

## 2016-08-02 DIAGNOSIS — S065X9D Traumatic subdural hemorrhage with loss of consciousness of unspecified duration, subsequent encounter: Secondary | ICD-10-CM | POA: Diagnosis not present

## 2016-08-02 DIAGNOSIS — E1142 Type 2 diabetes mellitus with diabetic polyneuropathy: Secondary | ICD-10-CM | POA: Diagnosis not present

## 2016-08-02 LAB — GLUCOSE, CAPILLARY
GLUCOSE-CAPILLARY: 239 mg/dL — AB (ref 65–99)
GLUCOSE-CAPILLARY: 199 mg/dL — AB (ref 65–99)
GLUCOSE-CAPILLARY: 193 mg/dL — AB (ref 65–99)
Glucose-Capillary: 170 mg/dL — ABNORMAL HIGH (ref 65–99)

## 2016-08-02 NOTE — Progress Notes (Signed)
Location:      Place of Service:  SNF (31) Provider:  Lorenso Quarry, NP-C  No primary care provider on file.  No care team member to display  Extended Emergency Contact Information Primary Emergency Contact: Kewley,Vernon Address: [address]          [place], [place] [address] [place] Home Phone: [phone] Relation: Son  Code Status:   DO NOT RESUSCITATE Goals of care: Advanced Directive information Advanced Directives 07/22/2016  Does patient have an advance directive? Yes  Type of Advance Directive Out of facility DNR (pink MOST or yellow form)     Chief Complaint  Patient presents with  . Acute Visit    HPI:  Pt is a 82 y.o. female seen today for an acute visit for  Evaluation of neurologic symptoms related to traumatic subdural hematoma. Patient was recently admitted from [facility] where she underwent observation after a traumatic subdural hematoma resulting from a fall in the Assisted Living facility. She was found to not be a surgical candidate for evacuation of the hematoma. She transferred to rehab for strengthening. She also had a UTI upon admission. Today, SLP was concerned about her Right eye. They observed photophobia and frequent closing. However, pt reports this has been evaluated by the neurologist without treatment. She reports Neurologist gave her eye exercises to adapt to the effects of the SDH. Pt denies this as being a new onset symptom. Pt does endorse constant headaches. Pt was able to point to the location of the headache- which is in the area of the hematoma/ head trauma. She says it is constant, but did not describe qualities. Pt also reports difficulty sleeping. She reports it is a result of her neuropathy in her legs hurting, causing her to not be able to sleep at night. Pt was on Melatonin in the ALF, but was not continued from the hospital records. Will restart this at a higher dose to also capture the benefit of headache suppression  from the Melatonin. Pt denies n/v/d/f/c/cp/sob/dizziness/abd pain. She also reports a productive cough, but this has not been heard/ noticed by staff. Lungs were clear. VSS. No other complaints.     No past medical history on file. No past surgical history on file.  No Known Allergies    Medication List       Accurate as of 08/02/16  5:13 PM. Always use your most recent med list.          aspirin EC 81 MG tablet Take 81 mg by mouth daily.   calcium-vitamin D 500-400 MG-UNIT tablet Commonly known as:  OSCAL-500 Take 1 tablet by mouth 2 (two) times daily.   cholecalciferol 1000 units tablet Commonly known as:  VITAMIN D Take 2,000 Units by mouth daily.   citalopram 40 MG tablet Commonly known as:  CELEXA Take 40 mg by mouth daily.   famotidine 20 MG tablet Commonly known as:  PEPCID Take 20 mg by mouth daily.   Fluticasone-Salmeterol 250-50 MCG/DOSE Aepb Commonly known as:  ADVAIR Inhale 1 puff into the lungs 2 (two) times daily.   furosemide 20 MG tablet Commonly known as:  LASIX Take 40 mg by mouth 2 (two) times daily.   hydrocortisone cream 1 % Apply 1 application topically 2 (two) times daily as needed for itching.   insulin NPH-regular Human (70-30) 100 UNIT/ML injection Commonly known as:  NOVOLIN 70/30 Inject 11 Units into the skin every morning.   insulin regular 100 units/mL injection  Commonly known as:  NOVOLIN R,HUMULIN R Inject 2-5 Units into the skin 3 (three) times daily before meals. Per sliding scale:  180-240=2 units 241-350=4 units 351-400=5 units >400=contact MD   ipratropium-albuterol 0.5-2.5 (3) MG/3ML Soln Commonly known as:  DUONEB Take 3 mLs by nebulization 2 (two) times daily as needed.   levothyroxine 112 MCG tablet Commonly known as:  SYNTHROID, LEVOTHROID Take 112 mcg by mouth daily.   lisinopril 2.5 MG tablet Commonly known as:  PRINIVIL,ZESTRIL Take 2.5 mg by mouth daily.   Melatonin 3 MG Tabs Take 1 tablet by mouth at  bedtime.   nystatin cream Commonly known as:  MYCOSTATIN Apply 1 application topically 2 (two) times daily as needed for dry skin.   oxyCODONE 5 MG immediate release tablet Commonly known as:  Oxy IR/ROXICODONE Take 5 mg by mouth every morning.   ROXICODONE 5 MG immediate release tablet Generic drug:  oxyCODONE Take 7.5 mg by mouth every 4 (four) hours as needed for severe pain.   polyethylene glycol packet Commonly known as:  MIRALAX / GLYCOLAX Take 17 g by mouth daily.   polyvinyl alcohol 1.4 % ophthalmic solution Commonly known as:  LIQUIFILM TEARS Place 1 drop into both eyes 2 (two) times daily as needed for dry eyes.   potassium citrate 10 MEQ (1080 MG) SR tablet Commonly known as:  UROCIT-K Take 10 mEq by mouth 3 (three) times daily with meals.   pregabalin 75 MG capsule Commonly known as:  LYRICA Take 75 mg by mouth daily.   pregabalin 100 MG capsule Commonly known as:  LYRICA Take 100 mg by mouth daily.   zolpidem 5 MG tablet Commonly known as:  AMBIEN Take 2.5 mg by mouth at bedtime as needed for sleep.       Review of Systems  Constitutional: Positive for fatigue. Negative for activity change, appetite change, chills, diaphoresis and fever.  HENT: Negative for congestion, facial swelling, sinus pressure, sneezing, sore throat, tinnitus, trouble swallowing and voice change.   Eyes: Positive for photophobia, itching and visual disturbance. Negative for pain, discharge and redness.  Respiratory: Negative for apnea, cough, choking, chest tightness, shortness of breath and wheezing.   Cardiovascular: Negative for chest pain, palpitations and leg swelling.  Gastrointestinal: Negative for abdominal distention, abdominal pain, constipation, diarrhea and nausea.  Genitourinary: Negative for difficulty urinating, dysuria, frequency and urgency.  Musculoskeletal: Positive for arthralgias (typical arthritis) and myalgias. Negative for back pain and gait problem.  Skin:  Positive for pallor. Negative for color change, rash and wound.  Neurological: Positive for weakness and headaches. Negative for dizziness, tremors, syncope, facial asymmetry, speech difficulty, light-headedness and numbness (tingling from neuropathy).  Psychiatric/Behavioral: Negative for agitation and behavioral problems.  All other systems reviewed and are negative.    There is no immunization history on file for this patient. There are no preventive care reminders to display for this patient. No flowsheet data found. Functional Status Survey:    Vitals:   08/02/16 1640  BP: (!) 112/51  Pulse: 81  Resp: 20  Temp: 98.2 F (36.8 C)  SpO2: 95%  Weight: 186 lb (84.4 kg)   There is no height or weight on file to calculate BMI. Physical Exam  Constitutional: She is oriented to person, place, and time. Vital signs are normal. She appears well-developed and well-nourished. She is active and cooperative. She does not appear ill. No distress.  HENT:  Head: Normocephalic and atraumatic.  Mouth/Throat: Uvula is midline and oropharynx is clear and  moist. Mucous membranes are not pale, dry and not cyanotic.  Eyes: Conjunctivae, EOM and lids are normal. Pupils are unequal (baseline).  Neck: Trachea normal, normal range of motion and full passive range of motion without pain. Neck supple. No JVD present. No tracheal deviation, no edema and no erythema present. No thyromegaly present.  Cardiovascular: Normal rate, regular rhythm, normal heart sounds and intact distal pulses.  Exam reveals no gallop, no distant heart sounds and no friction rub.   No murmur heard. Pulses:      Radial pulses are 2+ on the right side, and 2+ on the left side.       Dorsalis pedis pulses are 1+ on the right side, and 1+ on the left side.  No edema  Pulmonary/Chest: Effort normal and breath sounds normal. No accessory muscle usage. No respiratory distress. She has no decreased breath sounds. She has no wheezes. She  has no rhonchi. She has no rales. She exhibits no tenderness.  Abdominal: Soft. Normal appearance and bowel sounds are normal. She exhibits no distension and no ascites. There is no tenderness.  Musculoskeletal: Normal range of motion. She exhibits no edema or tenderness.  Expected osteoarthritis, stiffness; generalized weakness  Neurological: She is alert and oriented to person, place, and time. She has normal strength.  Skin: Skin is warm, dry and intact. She is not diaphoretic. No cyanosis. There is pallor. Nails show no clubbing.  Psychiatric: She has a normal mood and affect. Her speech is normal and behavior is normal. Judgment and thought content normal. Cognition and memory are normal.  Tired from a lot of activity today  Nursing note and vitals reviewed.   Labs reviewed:  Recent Labs  07/22/16 1601  NA 132*  K 3.8  CL 88*  CO2 37*  GLUCOSE 318*  BUN 25*  CREATININE 1.09*  CALCIUM 9.3    Recent Labs  07/22/16 1601  AST 229*  ALT 457*  ALKPHOS 142*  BILITOT 1.0  PROT 7.4  ALBUMIN 3.6    Recent Labs  07/22/16 1601  WBC 12.6*  NEUTROABS 10.3*  HGB 11.9*  HCT 36.2  MCV 90.3  PLT 174   Lab Results  Component Value Date   TSH 0.552 10/03/2013   Lab Results  Component Value Date   HGBA1C 6.0 10/07/2013   No results found for: CHOL, HDL, LDLCALC, LDLDIRECT, TRIG, CHOLHDL  Significant Diagnostic Results in last 30 days:  Ct Head Wo Contrast  Result Date: 07/22/2016 CLINICAL DATA:  Unwitnessed fall.  Altered level consciousness. EXAM: CT HEAD WITHOUT CONTRAST CT CERVICAL SPINE WITHOUT CONTRAST TECHNIQUE: Multidetector CT imaging of the head and cervical spine was performed following the standard protocol without intravenous contrast. Multiplanar CT image reconstructions of the cervical spine were also generated. COMPARISON:  None. FINDINGS: CT HEAD FINDINGS Brain: Large right subdural hematoma measuring up to 14 mm in thickness in the right frontal and  parietal lobe. There is mass-effect on the right hemisphere with 12 mm midline shift to the left. There is compression of the right lateral ventricle. No other areas of hemorrhage. Negative for acute infarct or mass. 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 Vascular: No hyperdense vessel or unexpected calcification. Skull: Negative for skull fracture.  Right parietal scalp hematoma. Sinuses/Orbits: Mucosal edema in the paranasal sinuses. Air-fluid level right sphenoid sinus. Normal orbit bilaterally. Other: None CT CERVICAL SPINE FINDINGS Alignment: Image quality degraded by significant motion. Mild anterior slip C3-4. Mild retrolisthesis C5-6. Skull base and vertebrae: Negative  for fracture or mass. Nondisplaced fracture could be missed given the amount of motion. Soft tissues and spinal canal: Negative Disc levels: Cervical spondylosis throughout the cervical spine most prominent at C4-5, C5-6, C6-7 Upper chest: Negative Other: None IMPRESSION: Large right subdural hematoma 14 mm in thickness. 12 mm midline shift toward the left. No other area of acute hemorrhage or infarction. No skull fracture Sinusitis with air-fluid level in the sphenoid sinus felt to be related to sinusitis. No skull base fracture. Negative for cervical spine fracture however the study is significantly degraded by motion. Critical Value/emergent results were called by telephone at the time of interpretation on 07/22/2016 at 5:31 pm to Dr. Carrie Mew , who verbally acknowledged these results. Electronically Signed   By: Franchot Gallo M.D.   On: 07/22/2016 17:31   Ct Cervical Spine Wo Contrast  Result Date: 07/22/2016 CLINICAL DATA:  Unwitnessed fall.  Altered level consciousness. EXAM: CT HEAD WITHOUT CONTRAST CT CERVICAL SPINE WITHOUT CONTRAST TECHNIQUE: Multidetector CT imaging of the head and cervical spine was performed following the standard protocol without intravenous contrast. Multiplanar CT image reconstructions  of the cervical spine were also generated. COMPARISON:  None. FINDINGS: CT HEAD FINDINGS Brain: Large right subdural hematoma measuring up to 14 mm in thickness in the right frontal and parietal lobe. There is mass-effect on the right hemisphere with 12 mm midline shift to the left. There is compression of the right lateral ventricle. No other areas of hemorrhage. Negative for acute infarct or mass. 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 Vascular: No hyperdense vessel or unexpected calcification. Skull: Negative for skull fracture.  Right parietal scalp hematoma. Sinuses/Orbits: Mucosal edema in the paranasal sinuses. Air-fluid level right sphenoid sinus. Normal orbit bilaterally. Other: None CT CERVICAL SPINE FINDINGS Alignment: Image quality degraded by significant motion. Mild anterior slip C3-4. Mild retrolisthesis C5-6. Skull base and vertebrae: Negative for fracture or mass. Nondisplaced fracture could be missed given the amount of motion. Soft tissues and spinal canal: Negative Disc levels: Cervical spondylosis throughout the cervical spine most prominent at C4-5, C5-6, C6-7 Upper chest: Negative Other: None IMPRESSION: Large right subdural hematoma 14 mm in thickness. 12 mm midline shift toward the left. No other area of acute hemorrhage or infarction. No skull fracture Sinusitis with air-fluid level in the sphenoid sinus felt to be related to sinusitis. No skull base fracture. Negative for cervical spine fracture however the study is significantly degraded by motion. Critical Value/emergent results were called by telephone at the time of interpretation on 07/22/2016 at 5:31 pm to Dr. Carrie Mew , who verbally acknowledged these results. Electronically Signed   By: Franchot Gallo M.D.   On: 07/22/2016 17:31   Dg Chest Portable 1 View  Result Date: 07/22/2016 CLINICAL DATA:  Fall. EXAM: PORTABLE CHEST 1 VIEW COMPARISON:  April 18, 2014 FINDINGS: Elevation of the right hemidiaphragm is  again identified. Low-attenuation in the right upper quadrant is probably within air-filled bowel, including colon beneath the diaphragm. The cardiomediastinal silhouette is stable. No pneumothorax. No other acute abnormalities. IMPRESSION: No active disease. Electronically Signed   By: Dorise Bullion III M.D   On: 07/22/2016 16:54   Dg Hip Unilat W Or Wo Pelvis 2-3 Views Right  Result Date: 07/22/2016 CLINICAL DATA:  Pain after fall. EXAM: DG HIP (WITH OR WITHOUT PELVIS) 2-3V RIGHT COMPARISON:  October 02, 2013 FINDINGS: The patient is status post repair of a right hip fracture. A gamma nail remains in place. The intra medullary rod  also remains in place. The distal interlocking screw has fractured. The rod itself is intact however. There is deformity of the proximal right femur at the site of previous fracture. The findings could all be chronic. It would be difficult to exclude an acute on chronic fracture. The more distal femur is intact with no distal femoral fracture. Vascular calcifications are noted. IMPRESSION: The patient is status post right hip fracture repair in 2014. Deformity of the proximal right hip/ femur may all be chronic but it would be difficult to exclude an acute on chronic fracture. The distal interlocking screw associated with the distal femoral rod has fractured. Electronically Signed   By: Dorise Bullion III M.D   On: 07/22/2016 17:02    Assessment/Plan 1. Diabetic polyneuropathy associated with type 2 diabetes mellitus (HCC)   gabapentin 100 mg by mouth daily at bedtime    labs  2. Traumatic subdural hemorrhage with loss of consciousness of unspecified duration, subsequent encounter  Melatonin 10 mg by mouth daily at bedtime for sleep and headache suppression  Family/ staff Communication:   Total Time:  75 minutes  Documentation:  60 minutes ( 45 minutes spent in research/ reviewing records from hospitalization )  Face to Face:  15  minutes  Family/Phone:   Labs/tests ordered:  CBC, met C, TSH, B12, vitamin D  Medication list reviewed and assessed for continued appropriateness.  Vikki Ports, NP-C Geriatrics [facility] [phone], [place] [address] Cell Phone (Mon-Fri 8am-5pm):  [phone] On Call:  [phone] & follow prompts after 5pm & weekends Office Phone:  [phone] Office Fax:  [phone]

## 2016-08-03 ENCOUNTER — Observation Stay
Admission: EM | Admit: 2016-08-03 | Discharge: 2016-08-04 | Disposition: A | Discharge status: Skilled Nursing Facility | Payer: Medicare Other | Attending: Internal Medicine | Admitting: Internal Medicine

## 2016-08-03 ENCOUNTER — Emergency Department: Payer: Medicare Other

## 2016-08-03 ENCOUNTER — Non-Acute Institutional Stay (SKILLED_NURSING_FACILITY): Payer: Medicare Other | Admitting: Gerontology

## 2016-08-03 DIAGNOSIS — I110 Hypertensive heart disease with heart failure: Secondary | ICD-10-CM | POA: Insufficient documentation

## 2016-08-03 DIAGNOSIS — G459 Transient cerebral ischemic attack, unspecified: Secondary | ICD-10-CM | POA: Diagnosis present

## 2016-08-03 DIAGNOSIS — M6281 Muscle weakness (generalized): Secondary | ICD-10-CM

## 2016-08-03 DIAGNOSIS — R51 Headache: Secondary | ICD-10-CM | POA: Insufficient documentation

## 2016-08-03 DIAGNOSIS — W19XXXD Unspecified fall, subsequent encounter: Secondary | ICD-10-CM | POA: Insufficient documentation

## 2016-08-03 DIAGNOSIS — S065X9D Traumatic subdural hemorrhage with loss of consciousness of unspecified duration, subsequent encounter: Secondary | ICD-10-CM | POA: Diagnosis not present

## 2016-08-03 DIAGNOSIS — Z9981 Dependence on supplemental oxygen: Secondary | ICD-10-CM | POA: Diagnosis not present

## 2016-08-03 DIAGNOSIS — Z794 Long term (current) use of insulin: Secondary | ICD-10-CM | POA: Insufficient documentation

## 2016-08-03 DIAGNOSIS — E1140 Type 2 diabetes mellitus with diabetic neuropathy, unspecified: Secondary | ICD-10-CM | POA: Insufficient documentation

## 2016-08-03 DIAGNOSIS — I69354 Hemiplegia and hemiparesis following cerebral infarction affecting left non-dominant side: Secondary | ICD-10-CM | POA: Diagnosis not present

## 2016-08-03 DIAGNOSIS — R2681 Unsteadiness on feet: Secondary | ICD-10-CM

## 2016-08-03 DIAGNOSIS — I4891 Unspecified atrial fibrillation: Secondary | ICD-10-CM | POA: Diagnosis not present

## 2016-08-03 DIAGNOSIS — Z9181 History of falling: Secondary | ICD-10-CM | POA: Insufficient documentation

## 2016-08-03 DIAGNOSIS — Z951 Presence of aortocoronary bypass graft: Secondary | ICD-10-CM | POA: Diagnosis not present

## 2016-08-03 DIAGNOSIS — S065XAA Traumatic subdural hemorrhage with loss of consciousness status unknown, initial encounter: Secondary | ICD-10-CM

## 2016-08-03 DIAGNOSIS — E079 Disorder of thyroid, unspecified: Secondary | ICD-10-CM | POA: Diagnosis not present

## 2016-08-03 DIAGNOSIS — J449 Chronic obstructive pulmonary disease, unspecified: Secondary | ICD-10-CM | POA: Insufficient documentation

## 2016-08-03 DIAGNOSIS — M4802 Spinal stenosis, cervical region: Secondary | ICD-10-CM | POA: Insufficient documentation

## 2016-08-03 DIAGNOSIS — S065X9A Traumatic subdural hemorrhage with loss of consciousness of unspecified duration, initial encounter: Secondary | ICD-10-CM

## 2016-08-03 DIAGNOSIS — I509 Heart failure, unspecified: Secondary | ICD-10-CM | POA: Insufficient documentation

## 2016-08-03 DIAGNOSIS — Z66 Do not resuscitate: Secondary | ICD-10-CM | POA: Diagnosis not present

## 2016-08-03 DIAGNOSIS — Z515 Encounter for palliative care: Secondary | ICD-10-CM | POA: Insufficient documentation

## 2016-08-03 DIAGNOSIS — R29898 Other symptoms and signs involving the musculoskeletal system: Secondary | ICD-10-CM

## 2016-08-03 DIAGNOSIS — G8929 Other chronic pain: Secondary | ICD-10-CM | POA: Diagnosis not present

## 2016-08-03 DIAGNOSIS — I6200 Nontraumatic subdural hemorrhage, unspecified: Secondary | ICD-10-CM | POA: Diagnosis present

## 2016-08-03 DIAGNOSIS — R262 Difficulty in walking, not elsewhere classified: Secondary | ICD-10-CM

## 2016-08-03 HISTORY — DX: Type 2 diabetes mellitus without complications: E11.9

## 2016-08-03 HISTORY — DX: Unspecified atrial fibrillation: I48.91

## 2016-08-03 HISTORY — DX: Heart failure, unspecified: I50.9

## 2016-08-03 HISTORY — DX: Urinary tract infection, site not specified: N39.0

## 2016-08-03 HISTORY — DX: Chronic obstructive pulmonary disease, unspecified: J44.9

## 2016-08-03 HISTORY — DX: Anemia, unspecified: D64.9

## 2016-08-03 HISTORY — DX: Nontraumatic subdural hemorrhage, unspecified: I62.00

## 2016-08-03 HISTORY — DX: Disorder of thyroid, unspecified: E07.9

## 2016-08-03 LAB — COMPREHENSIVE METABOLIC PANEL
ALK PHOS: 66 U/L (ref 38–126)
ALT: 25 U/L (ref 14–54)
ANION GAP: 7 (ref 5–15)
AST: 11 U/L — ABNORMAL LOW (ref 15–41)
Albumin: 2.9 g/dL — ABNORMAL LOW (ref 3.5–5.0)
BILIRUBIN TOTAL: 0.7 mg/dL (ref 0.3–1.2)
BUN: 14 mg/dL (ref 6–20)
CALCIUM: 9 mg/dL (ref 8.9–10.3)
CO2: 35 mmol/L — ABNORMAL HIGH (ref 22–32)
CREATININE: 0.68 mg/dL (ref 0.44–1.00)
Chloride: 94 mmol/L — ABNORMAL LOW (ref 101–111)
Glucose, Bld: 90 mg/dL (ref 65–99)
Potassium: 3.8 mmol/L (ref 3.5–5.1)
SODIUM: 136 mmol/L (ref 135–145)
TOTAL PROTEIN: 6.6 g/dL (ref 6.5–8.1)

## 2016-08-03 LAB — CBC WITH DIFFERENTIAL/PLATELET
Basophils Absolute: 0.1 10*3/uL (ref 0–0.1)
Basophils Relative: 1 %
EOS ABS: 0.1 10*3/uL (ref 0–0.7)
Eosinophils Relative: 1 %
HEMATOCRIT: 33.7 % — AB (ref 35.0–47.0)
HEMOGLOBIN: 11.3 g/dL — AB (ref 12.0–16.0)
LYMPHS ABS: 0.9 10*3/uL — AB (ref 1.0–3.6)
LYMPHS PCT: 9 %
MCH: 29.8 pg (ref 26.0–34.0)
MCHC: 33.5 g/dL (ref 32.0–36.0)
MCV: 88.9 fL (ref 80.0–100.0)
Monocytes Absolute: 1.1 10*3/uL — ABNORMAL HIGH (ref 0.2–0.9)
Monocytes Relative: 12 %
NEUTROS PCT: 77 %
Neutro Abs: 7.3 10*3/uL — ABNORMAL HIGH (ref 1.4–6.5)
Platelets: 217 10*3/uL (ref 150–440)
RBC: 3.79 MIL/uL — AB (ref 3.80–5.20)
RDW: 13.9 % (ref 11.5–14.5)
WBC: 9.4 10*3/uL (ref 3.6–11.0)

## 2016-08-03 LAB — URINALYSIS COMPLETE WITH MICROSCOPIC (ARMC ONLY)
BACTERIA UA: NONE SEEN
Bilirubin Urine: NEGATIVE
GLUCOSE, UA: NEGATIVE mg/dL
Hgb urine dipstick: NEGATIVE
Ketones, ur: NEGATIVE mg/dL
Leukocytes, UA: NEGATIVE
NITRITE: NEGATIVE
PROTEIN: NEGATIVE mg/dL
RBC / HPF: NONE SEEN RBC/hpf (ref 0–5)
Specific Gravity, Urine: 1.01 (ref 1.005–1.030)
pH: 7 (ref 5.0–8.0)

## 2016-08-03 LAB — BASIC METABOLIC PANEL
ANION GAP: 6 (ref 5–15)
BUN: 16 mg/dL (ref 6–20)
CALCIUM: 8.7 mg/dL — AB (ref 8.9–10.3)
CO2: 36 mmol/L — AB (ref 22–32)
Chloride: 94 mmol/L — ABNORMAL LOW (ref 101–111)
Creatinine, Ser: 0.8 mg/dL (ref 0.44–1.00)
GFR calc Af Amer: >60 mL/min (ref >60)
GLUCOSE: 155 mg/dL — AB (ref 65–99)
Potassium: 3.8 mmol/L (ref 3.5–5.1)
Sodium: 136 mmol/L (ref 135–145)

## 2016-08-03 LAB — TSH: TSH: 0.202 u[IU]/mL — AB (ref 0.350–4.500)

## 2016-08-03 LAB — CBC
HEMATOCRIT: 33.3 % — AB (ref 35.0–47.0)
HEMOGLOBIN: 11.3 g/dL — AB (ref 12.0–16.0)
MCH: 30.1 pg (ref 26.0–34.0)
MCHC: 33.8 g/dL (ref 32.0–36.0)
MCV: 88.9 fL (ref 80.0–100.0)
Platelets: 208 10*3/uL (ref 150–440)
RBC: 3.75 MIL/uL — ABNORMAL LOW (ref 3.80–5.20)
RDW: 13.7 % (ref 11.5–14.5)
WBC: 8.6 10*3/uL (ref 3.6–11.0)

## 2016-08-03 LAB — HEPATIC FUNCTION PANEL
ALK PHOS: 61 U/L (ref 38–126)
ALT: 25 U/L (ref 14–54)
AST: 12 U/L — ABNORMAL LOW (ref 15–41)
Albumin: 2.8 g/dL — ABNORMAL LOW (ref 3.5–5.0)
BILIRUBIN DIRECT: 0.2 mg/dL (ref 0.1–0.5)
BILIRUBIN INDIRECT: 0.5 mg/dL (ref 0.3–0.9)
BILIRUBIN TOTAL: 0.7 mg/dL (ref 0.3–1.2)
Total Protein: 6.2 g/dL — ABNORMAL LOW (ref 6.5–8.1)

## 2016-08-03 LAB — GLUCOSE, CAPILLARY
Glucose-Capillary: 96 mg/dL (ref 65–99)
Glucose-Capillary: 170 mg/dL — ABNORMAL HIGH (ref 65–99)
Glucose-Capillary: 92 mg/dL (ref 65–99)

## 2016-08-03 LAB — MRSA PCR SCREENING: MRSA BY PCR: NEGATIVE

## 2016-08-03 LAB — MAGNESIUM: Magnesium: 1.6 mg/dL — ABNORMAL LOW (ref 1.7–2.4)

## 2016-08-03 LAB — VITAMIN B12: VITAMIN B 12: 852 pg/mL (ref 180–914)

## 2016-08-03 MED ORDER — LEVOTHYROXINE SODIUM 112 MCG PO TABS: 112.0000 ug | ORAL_TABLET | Freq: Every day | ORAL | Status: DC

## 2016-08-03 MED ORDER — INSULIN ASPART 100 UNIT/ML ~~LOC~~ SOLN
0.0000 [IU] | Freq: Three times a day (TID) | SUBCUTANEOUS | Status: DC
  Administered 2016-08-04: 1 [IU] via SUBCUTANEOUS
  Administered 2016-08-04: 2 [IU] via SUBCUTANEOUS
  Filled 2016-08-03: qty 1
  Filled 2016-08-03: qty 2

## 2016-08-03 MED ORDER — VITAMIN D 1000 UNITS PO TABS
2000.0000 [IU] | ORAL_TABLET | Freq: Every day | ORAL | Status: DC
  Administered 2016-08-04: 2000 [IU] via ORAL
  Filled 2016-08-03: qty 2

## 2016-08-03 MED ORDER — ONDANSETRON HCL 4 MG PO TABS: 4.0000 mg | ORAL_TABLET | Freq: Four times a day (QID) | ORAL | Status: DC | PRN

## 2016-08-03 MED ORDER — MELATONIN 3 MG PO TABS: 1.0000 | ORAL_TABLET | Freq: Every day | ORAL | Status: DC

## 2016-08-03 MED ORDER — FUROSEMIDE 40 MG PO TABS
40.0000 mg | ORAL_TABLET | Freq: Every day | ORAL | Status: DC
  Administered 2016-08-04: 11:00:00 40 mg via ORAL
  Filled 2016-08-03: qty 1

## 2016-08-03 MED ORDER — NYSTATIN 100000 UNIT/GM EX CREA
1.0000 "application " | TOPICAL_CREAM | Freq: Two times a day (BID) | CUTANEOUS | Status: DC
  Administered 2016-08-03 – 2016-08-04 (×2): 1 via TOPICAL
  Filled 2016-08-03: qty 15

## 2016-08-03 MED ORDER — PREGABALIN 75 MG PO CAPS
75.0000 mg | ORAL_CAPSULE | Freq: Every day | ORAL | Status: DC
  Administered 2016-08-04: 11:00:00 75 mg via ORAL
  Filled 2016-08-03: qty 1

## 2016-08-03 MED ORDER — OXYCODONE HCL 5 MG PO TABS
7.5000 mg | ORAL_TABLET | ORAL | Status: DC | PRN
  Administered 2016-08-03: 7.5 mg via ORAL
  Filled 2016-08-03: qty 2

## 2016-08-03 MED ORDER — PREGABALIN 50 MG PO CAPS
100.0000 mg | ORAL_CAPSULE | Freq: Every day | ORAL | Status: DC
  Administered 2016-08-04: 100 mg via ORAL
  Filled 2016-08-03: qty 2

## 2016-08-03 MED ORDER — INSULIN ASPART 100 UNIT/ML ~~LOC~~ SOLN: 0.0000 [IU] | Freq: Every day | SUBCUTANEOUS | Status: DC

## 2016-08-03 MED ORDER — MOMETASONE FURO-FORMOTEROL FUM 200-5 MCG/ACT IN AERO
2.0000 | INHALATION_SPRAY | Freq: Two times a day (BID) | RESPIRATORY_TRACT | Status: DC
  Administered 2016-08-03 – 2016-08-04 (×2): 2 via RESPIRATORY_TRACT
  Filled 2016-08-03: qty 8.8

## 2016-08-03 MED ORDER — CITALOPRAM HYDROBROMIDE 20 MG PO TABS
40.0000 mg | ORAL_TABLET | Freq: Every day | ORAL | Status: DC
  Administered 2016-08-04: 40 mg via ORAL
  Filled 2016-08-03: qty 2

## 2016-08-03 MED ORDER — ACETAMINOPHEN 325 MG PO TABS: 650.0000 mg | ORAL_TABLET | Freq: Four times a day (QID) | ORAL | Status: DC | PRN

## 2016-08-03 MED ORDER — LISINOPRIL 5 MG PO TABS
2.5000 mg | ORAL_TABLET | Freq: Every day | ORAL | Status: DC
  Administered 2016-08-04: 11:00:00 2.5 mg via ORAL
  Filled 2016-08-03: qty 1

## 2016-08-03 MED ORDER — ZOLPIDEM TARTRATE 5 MG PO TABS: 2.5000 mg | ORAL_TABLET | Freq: Every evening | ORAL | Status: DC | PRN

## 2016-08-03 MED ORDER — MELATONIN 5 MG PO TABS
5.0000 mg | ORAL_TABLET | Freq: Every day | ORAL | Status: DC
  Administered 2016-08-03: 23:00:00 5 mg via ORAL
  Filled 2016-08-03 (×2): qty 1

## 2016-08-03 MED ORDER — IPRATROPIUM-ALBUTEROL 0.5-2.5 (3) MG/3ML IN SOLN: 3.0000 mL | Freq: Four times a day (QID) | RESPIRATORY_TRACT | Status: DC | PRN

## 2016-08-03 MED ORDER — MAGNESIUM SULFATE 2 GM/50ML IV SOLN
2.0000 g | Freq: Once | INTRAVENOUS | Status: AC
  Administered 2016-08-03: 2 g via INTRAVENOUS
  Filled 2016-08-03: qty 50

## 2016-08-03 MED ORDER — POLYVINYL ALCOHOL 1.4 % OP SOLN
1.0000 [drp] | Freq: Two times a day (BID) | OPHTHALMIC | Status: DC | PRN
  Filled 2016-08-03: qty 15

## 2016-08-03 MED ORDER — POLYETHYLENE GLYCOL 3350 17 G PO PACK
17.0000 g | PACK | Freq: Every day | ORAL | Status: DC
  Administered 2016-08-04: 17 g via ORAL
  Filled 2016-08-03: qty 1

## 2016-08-03 MED ORDER — ONDANSETRON HCL 4 MG/2ML IJ SOLN: 4.0000 mg | Freq: Four times a day (QID) | INTRAMUSCULAR | Status: DC | PRN

## 2016-08-03 MED ORDER — FAMOTIDINE 20 MG PO TABS
20.0000 mg | ORAL_TABLET | Freq: Every day | ORAL | Status: DC
  Administered 2016-08-04: 20 mg via ORAL
  Filled 2016-08-03: qty 1

## 2016-08-03 MED ORDER — CALCIUM CARBONATE-VITAMIN D 500-200 MG-UNIT PO TABS
1.0000 | ORAL_TABLET | Freq: Two times a day (BID) | ORAL | Status: DC
  Administered 2016-08-03 – 2016-08-04 (×2): 1 via ORAL
  Filled 2016-08-03 (×2): qty 1

## 2016-08-03 MED ORDER — INSULIN ASPART PROT & ASPART (70-30 MIX) 100 UNIT/ML ~~LOC~~ SUSP
11.0000 [IU] | Freq: Every day | SUBCUTANEOUS | Status: DC
  Administered 2016-08-04: 11 [IU] via SUBCUTANEOUS
  Filled 2016-08-03: qty 11

## 2016-08-03 MED ORDER — OXYCODONE HCL 5 MG PO TABS
5.0000 mg | ORAL_TABLET | Freq: Every morning | ORAL | Status: DC
  Administered 2016-08-04: 11:00:00 5 mg via ORAL
  Filled 2016-08-03: qty 1

## 2016-08-03 MED ORDER — LEVOTHYROXINE SODIUM 112 MCG PO TABS
112.0000 ug | ORAL_TABLET | Freq: Every day | ORAL | Status: DC
  Filled 2016-08-03: qty 1

## 2016-08-03 MED ORDER — ACETAMINOPHEN 650 MG RE SUPP: 650.0000 mg | Freq: Four times a day (QID) | RECTAL | Status: DC | PRN

## 2016-08-03 NOTE — ED Provider Notes (Signed)
[facility] Emergency Department Provider Note    First MD Initiated Contact with Patient 08/03/16 1137     (approximate)  I have reviewed the triage vital signs and the nursing notes.   HISTORY  Chief Complaint Altered Mental Status  Level V Caveat:  AMS  HPI Rachel Vazquez is a 82 y.o. female with recently diagnosed subdural hematoma presents due to altered mental status and left upper extremity weakness. Patient was recently seen in the ER here and transferred to [facility] for neurosurgical evaluation. Patient coming from [facility] facility with arm toward mental status that started at 9:30. Per staff report patient would not following commands. Also complaining of difficulty moving left upper extremity which is reportedly new. Last seen normal was yesterday. Son at bedside states that he's been frequent checking on her and did not have any notable weakness or difficulty moving left upper extremity yesterday. Patient states that she is having trouble grasping onto things. Denies any chest pain. Admits to neck pain. No new medications. No shortness of breath and excess is normal. She wears 2 L nasal cannula at baseline.   Past Medical History:  Diagnosis Date  . A-fib (HCC)   . Anemia   . CHF (congestive heart failure) (HCC)   . COPD (chronic obstructive pulmonary disease) (HCC)   . Diabetes mellitus without complication (HCC)   . Subdural hemorrhage (HCC)   . Thyroid disease   . UTI (urinary tract infection)     Patient Active Problem List   Diagnosis Date Noted  . Diabetic neuropathy (HCC) 08/02/2016  . Traumatic subdural hemorrhage with loss of consciousness of unspecified duration, subsequent encounter 08/02/2016    No past surgical history on file.  Prior to Admission medications   Medication Sig Start Date End Date Taking? Authorizing Provider  aspirin EC 81 MG tablet Take 81 mg by mouth daily.    Historical Provider, MD  calcium-vitamin D  (OSCAL-500) 500-400 MG-UNIT tablet Take 1 tablet by mouth 2 (two) times daily.    Historical Provider, MD  cholecalciferol (VITAMIN D) 1000 units tablet Take 2,000 Units by mouth daily.    Historical Provider, MD  citalopram (CELEXA) 40 MG tablet Take 40 mg by mouth daily.    Historical Provider, MD  famotidine (PEPCID) 20 MG tablet Take 20 mg by mouth daily.    Historical Provider, MD  Fluticasone-Salmeterol (ADVAIR) 250-50 MCG/DOSE AEPB Inhale 1 puff into the lungs 2 (two) times daily.    Historical Provider, MD  furosemide (LASIX) 20 MG tablet Take 40 mg by mouth 2 (two) times daily.    Historical Provider, MD  hydrocortisone cream 1 % Apply 1 application topically 2 (two) times daily as needed for itching.    Historical Provider, MD  insulin NPH-regular Human (NOVOLIN 70/30) (70-30) 100 UNIT/ML injection Inject 11 Units into the skin every morning.    Historical Provider, MD  insulin regular (NOVOLIN R,HUMULIN R) 100 units/mL injection Inject 2-5 Units into the skin 3 (three) times daily before meals. Per sliding scale:  180-240=2 units 241-350=4 units 351-400=5 units >400=contact MD    Historical Provider, MD  ipratropium-albuterol (DUONEB) 0.5-2.5 (3) MG/3ML SOLN Take 3 mLs by nebulization 2 (two) times daily as needed.    Historical Provider, MD  levothyroxine (SYNTHROID, LEVOTHROID) 112 MCG tablet Take 112 mcg by mouth daily.    Historical Provider, MD  lisinopril (PRINIVIL,ZESTRIL) 2.5 MG tablet Take 2.5 mg by mouth daily.    Historical Provider, MD  Melatonin 3  MG TABS Take 1 tablet by mouth at bedtime.    Historical Provider, MD  nystatin cream (MYCOSTATIN) Apply 1 application topically 2 (two) times daily as needed for dry skin.    Historical Provider, MD  oxyCODONE (OXY IR/ROXICODONE) 5 MG immediate release tablet Take 5 mg by mouth every morning.    Historical Provider, MD  oxyCODONE (ROXICODONE) 5 MG immediate release tablet Take 7.5 mg by mouth every 4 (four) hours as needed for  severe pain.    Historical Provider, MD  polyethylene glycol (MIRALAX / GLYCOLAX) packet Take 17 g by mouth daily.    Historical Provider, MD  polyvinyl alcohol (LIQUIFILM TEARS) 1.4 % ophthalmic solution Place 1 drop into both eyes 2 (two) times daily as needed for dry eyes.    Historical Provider, MD  potassium citrate (UROCIT-K) 10 MEQ (1080 MG) SR tablet Take 10 mEq by mouth 3 (three) times daily with meals.    Historical Provider, MD  pregabalin (LYRICA) 100 MG capsule Take 100 mg by mouth daily.    Historical Provider, MD  pregabalin (LYRICA) 75 MG capsule Take 75 mg by mouth daily.    Historical Provider, MD  zolpidem (AMBIEN) 5 MG tablet Take 2.5 mg by mouth at bedtime as needed for sleep.    Historical Provider, MD    Allergies Review of patient's allergies indicates no known allergies.  No family history on file.  Social History Social History  Substance Use Topics  . Smoking status: Never Smoker  . Smokeless tobacco: Never Used  . Alcohol use No    Review of Systems Patient denies headaches, rhinorrhea, blurry vision, numbness, shortness of breath, chest pain, edema, cough, abdominal pain, nausea, vomiting, diarrhea, dysuria, fevers, rashes or hallucinations unless otherwise stated above in HPI. ____________________________________________   PHYSICAL EXAM:  VITAL SIGNS: Vitals:   08/03/16 1149  BP: 102/64  Pulse: 60  Resp: 18  Temp: 98.3 F (36.8 C)    Constitutional: Alert.  Ill appearing but in no acute distress. Eyes: Conjunctivae are normal. PERRL. EOMI. Head: Atraumatic. Nose: No congestion/rhinnorhea. Mouth/Throat: Mucous membranes are moist.  Oropharynx non-erythematous. Neck: No stridor. Mild diffuse tenderness to palpation. No step offs or deformities Hematological/Lymphatic/Immunilogical: No cervical lymphadenopathy. Cardiovascular: Normal rate, regular rhythm. Grossly normal heart sounds.  Good peripheral circulation. Respiratory: Normal  respiratory effort.  No retractions. Lungs with coarse bibasilar breathsounds Gastrointestinal: Soft and nontender. No distention. No abdominal bruits. No CVA tenderness. Musculoskeletal: No lower extremity tenderness nor edema.  No joint effusions. Neurologic:  Slow speech, lue with hand held in grasp position. Moving all other extremities without deficit.  No facial droop. Skin:  Skin is warm, dry and intact. No rash noted.  ____________________________________________   LABS (all labs ordered are listed, but only abnormal results are displayed)  Results for orders placed or performed during the hospital encounter of 08/03/16 (from the past 24 hour(s))  Basic metabolic panel     Status: Abnormal   Collection Time: 08/03/16 11:57 AM  Result Value Ref Range   Sodium 136 135 - 145 mmol/L   Potassium 3.8 3.5 - 5.1 mmol/L   Chloride 94 (L) 101 - 111 mmol/L   CO2 36 (H) 22 - 32 mmol/L   Glucose, Bld 155 (H) 65 - 99 mg/dL   BUN 16 6 - 20 mg/dL   Creatinine, Ser 4.54 0.44 - 1.00 mg/dL   Calcium 8.7 (L) 8.9 - 10.3 mg/dL   GFR calc non Af Amer >60 >60 mL/min  GFR calc Af Amer >60 >60 mL/min   Anion gap 6 5 - 15  CBC     Status: Abnormal   Collection Time: 08/03/16 11:57 AM  Result Value Ref Range   WBC 8.6 3.6 - 11.0 K/uL   RBC 3.75 (L) 3.80 - 5.20 MIL/uL   Hemoglobin 11.3 (L) 12.0 - 16.0 g/dL   HCT 40.9 (L) 81.1 - 91.4 %   MCV 88.9 80.0 - 100.0 fL   MCH 30.1 26.0 - 34.0 pg   MCHC 33.8 32.0 - 36.0 g/dL   RDW 78.2 95.6 - 21.3 %   Platelets 208 150 - 440 K/uL  Urinalysis complete, with microscopic     Status: Abnormal   Collection Time: 08/03/16 11:57 AM  Result Value Ref Range   Color, Urine YELLOW (A) YELLOW   APPearance CLEAR (A) CLEAR   Glucose, UA NEGATIVE NEGATIVE mg/dL   Bilirubin Urine NEGATIVE NEGATIVE   Ketones, ur NEGATIVE NEGATIVE mg/dL   Specific Gravity, Urine 1.010 1.005 - 1.030   Hgb urine dipstick NEGATIVE NEGATIVE   pH 7.0 5.0 - 8.0   Protein, ur NEGATIVE  NEGATIVE mg/dL   Nitrite NEGATIVE NEGATIVE   Leukocytes, UA NEGATIVE NEGATIVE   RBC / HPF NONE SEEN 0 - 5 RBC/hpf   WBC, UA 0-5 0 - 5 WBC/hpf   Bacteria, UA NONE SEEN NONE SEEN   Squamous Epithelial / LPF 0-5 (A) NONE SEEN   Mucous PRESENT   Hepatic function panel     Status: Abnormal   Collection Time: 08/03/16 11:57 AM  Result Value Ref Range   Total Protein 6.2 (L) 6.5 - 8.1 g/dL   Albumin 2.8 (L) 3.5 - 5.0 g/dL   AST 12 (L) 15 - 41 U/L   ALT 25 14 - 54 U/L   Alkaline Phosphatase 61 38 - 126 U/L   Total Bilirubin 0.7 0.3 - 1.2 mg/dL   Bilirubin, Direct 0.2 0.1 - 0.5 mg/dL   Indirect Bilirubin 0.5 0.3 - 0.9 mg/dL  Glucose, capillary     Status: None   Collection Time: 08/03/16  9:27 PM  Result Value Ref Range   Glucose-Capillary 92 65 - 99 mg/dL   ____________________________________________  EKG My review and personal interpretation at Time: 11:48   Indication: ams  Rate: 70  Rhythm: sinus Axis: normal Other: T wave inversions in V1-V3, no acute ST elevations or depressions ____________________________________________  RADIOLOGY  I personally reviewed all radiographic images ordered to evaluate for the above acute complaints and reviewed radiology reports and findings.  These findings were personally discussed with the patient.  Please see medical record for radiology report.  ____________________________________________   PROCEDURES  Procedure(s) performed: none    Critical Care performed: no ____________________________________________   INITIAL IMPRESSION / ASSESSMENT AND PLAN / ED COURSE  Pertinent labs & imaging results that were available during my care of the patient were reviewed by me and considered in my medical decision making (see chart for details).  DDX: sdh, sah, iph, cva, acs, sepsis  Anacaren Kohan is a 82 y.o. who presents to the ED with with new left upper extremity weakness. Presentation is, By recent subdural hematoma.  Patient is not on any  anticoagulation. Patient arrives afebrile and hemodynamic stable but with new deficit. Son at bedside and is adamant that this weakness was not present yesterday. She does not have any evidence of sepsis. No other associated deficits. Will order CT imaging to evaluate for worsening subdural hematoma versus underlying CVA.  The patient will be placed on continuous pulse oximetry and telemetry for monitoring.  Laboratory evaluation will be sent to evaluate for the above complaints.      Clinical Course  Comment By Time  Patient reassessed. I updated patient and family regarding CT imaging. Patient states that she would not want transfer for neurosurgical evaluation. She is aware of the poor prognosis and, complicated medical physician due to possible CVA versus worsening subdural. States that she would not want any interventions at this time he was ordered so to worsening of her disability first death. Patient and family are requesting hospice evaluation [facility] to [facility].  We'll touch base with case management. Willy EddyPatrick Eulonda Andalon, MD 10/26 1425  Currently awaiting callback from [facility] to see if they have a bed available for patient. Willy EddyPatrick Talar Fraley, MD 10/26 1540  HR of 145 entered in error.  HR currently 66 Willy EddyPatrick Malli Falotico, MD 10/26 1559  After several hours a social work case management working with patient and home place to achieve placement, the facility has stated that they do not feel comfortable except the patient back to their facility at this time as she has new weakness. I reiterated that the patient and with does not wish to have any further interventions at this time and has good understanding of her medical illness including subdural hematoma as well as probable stroke. At this point as this facility is only assisted care do feel it would be safest for the patient to be admitted overnight for physical therapy and occupational therapy evaluation as well as further  hemodynamic monitoring with her new weakness. Patient again declines further evaluation of weakness with MRI.  Have discussed with the patient and available family all diagnostics and treatments performed thus far and all questions were answered to the best of my ability. The patient demonstrates understanding and agreement with plan.  Willy EddyPatrick Petra Sargeant, MD 10/26 1820     ____________________________________________   FINAL CLINICAL IMPRESSION(S) / ED DIAGNOSES  Final diagnoses:  LUE weakness  SDH (subdural hematoma) (HCC)  Palliative care encounter      NEW MEDICATIONS STARTED DURING THIS VISIT:  New Prescriptions   No medications on file     Note:  This document was prepared using Dragon voice recognition software and may include unintentional dictation errors.    Willy EddyPatrick Megann Easterwood, MD 08/03/16 2204

## 2016-08-03 NOTE — Care Management Note (Signed)
Case Management Note  Patient Details  Name: Jinny BlossomWanda Stoney MRN: [id number] Date of Birth: [birth year]  Subjective/Objective:    Had already [facility] as noted previously, so she knew the patient was [facility]. Family expressed [facility] was the only place they were comfortable in taking the patient  To. This has made it very difficult to go forward , along with the reluctance of the 2 facilities to accommodate the patient.    Hopefully tomorrow , the CSW can help them find a solution for the comfort and wellbeing of the patient and family.       Action/Plan:   Expected Discharge Date:                  Expected Discharge Plan:     In-House Referral:     Discharge planning Services     Post Acute Care Choice:    Choice offered to:     DME Arranged:    DME Agency:     HH Arranged:    HH Agency:     Status of Service:     If discussed at [employer] Length of Stay Meetings, dates discussed:    Additional Comments:  Berna BueCheryl Courtney Fenlon, RN 08/03/2016, 6:45 PM

## 2016-08-03 NOTE — ED Notes (Signed)
Pt states that she has to urinate, unable to void using bedpan. Too weak to get up and walk at this time. Kennith Centerracey, EDT to assist this RN for in and out catheter and collection of urine sample.

## 2016-08-03 NOTE — ED Notes (Signed)
Pt son Jackie PlumVernon Lesage number [phone]

## 2016-08-03 NOTE — ED Notes (Signed)
[facility] [phone]

## 2016-08-03 NOTE — [place] FL2 (Signed)
[place] MEDICAID FL2 LEVEL OF CARE SCREENING TOOL     IDENTIFICATION  Patient Name: Rachel BlossomWanda Espe Birthdate: [birth year] Sex: female Admission Date (Current Location): 08/03/2016  [facility] [place]   Facility and Address:  [facility], [address], [place], [place] [address]      Provider Number: [id number]  Attending Physician Name and Address:  Willy EddyPatrick Robinson, MD  Relative Name and Phone Number:       Current Level of Care: Hospital Recommended Level of Care: Assisted Living Facility Prior Approval Number: [id number]  Date Approved/Denied:   PASRR Number:    Discharge Plan: Other (Comment) (ALF)    Current Diagnoses: Patient Active Problem List   Diagnosis Date Noted  . Diabetic neuropathy (HCC) 08/02/2016  . Traumatic subdural hemorrhage with loss of consciousness of unspecified duration, subsequent encounter 08/02/2016    Orientation RESPIRATION BLADDER Height & Weight     Self, Time, Situation, Place  O2 (Nasal Cannula) Continent Weight: 185 lb (83.9 kg) Height:     BEHAVIORAL SYMPTOMS/MOOD NEUROLOGICAL BOWEL NUTRITION STATUS      Continent Diet  AMBULATORY STATUS COMMUNICATION OF NEEDS Skin   Limited Assist Verbally Normal                       Personal Care Assistance Level of Assistance  Bathing, Feeding, Dressing Bathing Assistance: Limited assistance Feeding assistance: Independent Dressing Assistance: Limited assistance     Functional Limitations Info  Sight, Hearing, Speech Sight Info: Adequate Hearing Info: Adequate Speech Info: Adequate    SPECIAL CARE FACTORS FREQUENCY                       Contractures Contractures Info: Not present    Additional Factors Info  Code Status, Allergies Code Status Info: Not file  Allergies Info: No known allergies            Current Medications (08/03/2016):  This is the current hospital active medication list No current  facility-administered medications for this encounter.    Current Outpatient Prescriptions  Medication Sig Dispense Refill  . aspirin EC 81 MG tablet Take 81 mg by mouth daily.    . calcium-vitamin D (OSCAL-500) 500-400 MG-UNIT tablet Take 1 tablet by mouth 2 (two) times daily.    . cholecalciferol (VITAMIN D) 1000 units tablet Take 2,000 Units by mouth daily.    . citalopram (CELEXA) 40 MG tablet Take 40 mg by mouth daily.    . famotidine (PEPCID) 20 MG tablet Take 20 mg by mouth daily.    . Fluticasone-Salmeterol (ADVAIR) 250-50 MCG/DOSE AEPB Inhale 1 puff into the lungs 2 (two) times daily.    . furosemide (LASIX) 20 MG tablet Take 40 mg by mouth 2 (two) times daily.    . hydrocortisone cream 1 % Apply 1 application topically 2 (two) times daily as needed for itching.    . insulin NPH-regular Human (NOVOLIN 70/30) (70-30) 100 UNIT/ML injection Inject 11 Units into the skin every morning.    . insulin regular (NOVOLIN R,HUMULIN R) 100 units/mL injection Inject 2-5 Units into the skin 3 (three) times daily before meals. Per sliding scale:  180-240=2 units 241-350=4 units 351-400=5 units >400=contact MD    . ipratropium-albuterol (DUONEB) 0.5-2.5 (3) MG/3ML SOLN Take 3 mLs by nebulization 2 (two) times daily as needed.    Marland Kitchen. levothyroxine (SYNTHROID, LEVOTHROID) 112 MCG tablet Take 112 mcg by mouth daily.    Marland Kitchen. lisinopril (  PRINIVIL,ZESTRIL) 2.5 MG tablet Take 2.5 mg by mouth daily.    . Melatonin 3 MG TABS Take 1 tablet by mouth at bedtime.    Marland Kitchen nystatin cream (MYCOSTATIN) Apply 1 application topically 2 (two) times daily as needed for dry skin.    Marland Kitchen oxyCODONE (OXY IR/ROXICODONE) 5 MG immediate release tablet Take 5 mg by mouth every morning.    Marland Kitchen oxyCODONE (ROXICODONE) 5 MG immediate release tablet Take 7.5 mg by mouth every 4 (four) hours as needed for severe pain.    . polyethylene glycol (MIRALAX / GLYCOLAX) packet Take 17 g by mouth daily.    . polyvinyl alcohol (LIQUIFILM TEARS) 1.4 %  ophthalmic solution Place 1 drop into both eyes 2 (two) times daily as needed for dry eyes.    . potassium citrate (UROCIT-K) 10 MEQ (1080 MG) SR tablet Take 10 mEq by mouth 3 (three) times daily with meals.    . pregabalin (LYRICA) 100 MG capsule Take 100 mg by mouth daily.    . pregabalin (LYRICA) 75 MG capsule Take 75 mg by mouth daily.    Marland Kitchen zolpidem (AMBIEN) 5 MG tablet Take 2.5 mg by mouth at bedtime as needed for sleep.       Discharge Medications: Please see discharge summary for a list of discharge medications.  Relevant Imaging Results:  Relevant Lab Results:   Additional Information SSN: [id number]  Jonathon Jordan, LCSWA

## 2016-08-03 NOTE — Care Management Note (Signed)
Case Management Note  Patient Details  Name: Rachel Vazquez MRN: [id number] Date of Birth: 09/20/1929  Subjective/Objective:    Spoke to family at bedside who say they, and the patient [facility] [place]They say they are paying the bill for her bed in dependant care, which I believe is assisted living. I have contacted [place] with [facility] at [phone] , and she has verified that she will take the referral and they will service the patient whether [facility] or [facility].  I have also contacted [facility]nie at [facility] who says she was aware of the patient  Status, and is requesting an FL2 be sent to her at Fax=[phone]. Larita FifeLynn from CSW has agreed to do this for the patient.  I have mae the patient, family ,RN, and MD aware of the process going on. Awaiting decision from [facility]. [facility]nie at [facility] confirms that they work well with Hospice support, so there are no concerns there.              Action/Plan:   Expected Discharge Date:                  Expected Discharge Plan:     In-House Referral:     Discharge planning Services     Post Acute Care Choice:    Choice offered to:     DME Arranged:    DME Agency:     HH Arranged:    HH Agency:     Status of Service:     If discussed at [employer] Length of Stay Meetings, dates discussed:    Additional Comments:  Berna BueCheryl Keontae Levingston, RN 08/03/2016, 2:38 PM

## 2016-08-03 NOTE — Progress Notes (Signed)
CSW has spoken at length with pt's family, [facility], and [facility]. Pt's family wish for [facility]. [facility] Kendal Hymen(Bonnie) [phone] has stated that at this time they are not comfortable accepting pt back to the facility due to her condition. Pt could potentially be a candidate for Hospice. [facility] is requesting that pt be kept overnight and evaluated for Hospice appropriateness and they will reconsider pt for placement in the morning. CSW notified EDP and pt's family about above.   Jonathon JordanLynn B Vada Swift, MSW, Theresia MajorsLCSWA [phone]

## 2016-08-03 NOTE — ED Notes (Signed)
HR validated on 1430 as 145 made in error.

## 2016-08-03 NOTE — ED Triage Notes (Signed)
[facility] with cc of AMS that suddenly began at 0930 today. Staff stated that patient "would not do anything" at 0930 today. When EMS arrived, pt was alert and oriented X4, moved all extremities. [facility] after fall and subdural head bleed X over 1 week ago. Pt able to move left arm when asked, but slow to do so. Pt speech clear. Pt wearing left leg brace that she wears chronically.

## 2016-08-03 NOTE — ED Notes (Signed)
Son at patient bedside.

## 2016-08-03 NOTE — ED Notes (Signed)
MD at bedside. 

## 2016-08-03 NOTE — H&P (Addendum)
[facility]   PATIENT NAME: Rachel Vazquez    MR#:  [id number]  DATE OF BIRTH:  July 12, 1929  DATE OF ADMISSION:  08/03/2016  PRIMARY CARE PHYSICIAN: Pcp Not In System   REQUESTING/REFERRING PHYSICIAN: Dr. Willy Eddy  CHIEF COMPLAINT:   Chief Complaint  Patient presents with  . Altered Mental Status    HISTORY OF PRESENT ILLNESS:  Rachel Vazquez  is a 82 y.o. female with a known history of Atrial fibrillation currently not on any anticoagulation due to recent subdural hemorrhage 2 weeks ago, congestive heart failure, COPD on home oxygen, diabetes, anemia, recent admission to [facility] for subdural hemorrhage presents from [facility] rehabilitation secondary to left-sided weakness. Patient is originally from [facility] assisted living. She was independent prior to her fall and subdural hemorrhage 2 weeks ago. Due to her hard of hearing, she is a poor historian. Most of the history is obtained from her son who is her healthcare power of attorney. The left-sided weakness was noticed today, more in the lower extremity than upper extremity. CT of the head showing old right cerebellar infarct and no new infarct. Subdural hemorrhage is slightly decreased in size but still present. She had prior neck injury and chronic changes in the C-spine noted. The situation was presented to the family that this could be a stroke versus TIA especially with her atrial fibrillation and not being on anticoagulation. However with this subdural hemorrhage, she probably might not be a candidate for anticoagulation at this time. Family do not want any MRI or other workup done for stroke. They want her to get to [facility] place after PT evaluation to evaluate her baseline at this time. They want hospice to follow the patient.  PAST MEDICAL HISTORY:   Past Medical History:  Diagnosis Date  . A-fib (HCC)   . Anemia   . CHF (congestive heart failure) (HCC)   . COPD (chronic obstructive  pulmonary disease) (HCC)   . Diabetes mellitus without complication (HCC)   . Subdural hemorrhage (HCC)   . Thyroid disease   . UTI (urinary tract infection)     PAST SURGICAL HISTORY:   Past Surgical History:  Procedure Laterality Date  . CORONARY ARTERY BYPASS GRAFT      SOCIAL HISTORY:   Social History  Substance Use Topics  . Smoking status: Never Smoker  . Smokeless tobacco: Never Used  . Alcohol use No    FAMILY HISTORY:  No family history on file.  DRUG ALLERGIES:  No Known Allergies  REVIEW OF SYSTEMS:   Review of Systems  Constitutional: Positive for malaise/fatigue. Negative for chills, fever and weight loss.  HENT: Positive for hearing loss. Negative for ear discharge, ear pain and nosebleeds.   Eyes: Negative for blurred vision, double vision and photophobia.  Respiratory: Negative for cough, hemoptysis, shortness of breath and wheezing.   Cardiovascular: Positive for orthopnea. Negative for chest pain, palpitations and leg swelling.  Gastrointestinal: Negative for abdominal pain, constipation, diarrhea, heartburn, melena, nausea and vomiting.  Genitourinary: Negative for dysuria, frequency, hematuria and urgency.  Musculoskeletal: Positive for neck pain. Negative for back pain and myalgias.  Skin: Negative for rash.  Neurological: Positive for focal weakness. Negative for dizziness, tingling, sensory change, speech change and headaches.  Endo/Heme/Allergies: Does not bruise/bleed easily.  Psychiatric/Behavioral: Negative for depression.    MEDICATIONS AT HOME:   Prior to Admission medications   Medication Sig Start Date End Date Taking? Authorizing Provider  acetaminophen (TYLENOL) 325  MG tablet Take 650 mg by mouth every 6 (six) hours as needed.   Yes Historical Provider, MD  albuterol (PROVENTIL HFA;VENTOLIN HFA) 108 (90 Base) MCG/ACT inhaler Inhale 2 puffs into the lungs every 6 (six) hours as needed for wheezing or shortness of breath.   Yes  Historical Provider, MD  carvedilol (COREG) 6.25 MG tablet Take 6.25 mg by mouth 2 (two) times daily with a meal.   Yes Historical Provider, MD  cefUROXime (CEFTIN) 500 MG tablet Take 500 mg by mouth 2 (two) times daily with a meal. 07/26/16 08/05/16 Yes Historical Provider, MD  citalopram (CELEXA) 40 MG tablet Take 40 mg by mouth daily.   Yes Historical Provider, MD  Fluticasone-Salmeterol (ADVAIR) 250-50 MCG/DOSE AEPB Inhale 1 puff into the lungs 2 (two) times daily.   Yes Historical Provider, MD  furosemide (LASIX) 20 MG tablet Take 40 mg by mouth 2 (two) times daily.   Yes Historical Provider, MD  gabapentin (NEURONTIN) 100 MG capsule Take 100 mg by mouth at bedtime.   Yes Historical Provider, MD  insulin NPH Human (HUMULIN N,NOVOLIN N) 100 UNIT/ML injection Inject 5 Units into the skin 2 (two) times daily before a meal.   Yes Historical Provider, MD  insulin regular (NOVOLIN R,HUMULIN R) 100 units/mL injection Inject 3-12 Units into the skin 3 (three) times daily before meals. And at bedtime as needed. Per sliding scale. If blood sugar is less than 60 call dr 176-250=3 units 251-325=6 units 326-450=10 units >450=12 units   Yes Historical Provider, MD  ipratropium (ATROVENT HFA) 17 MCG/ACT inhaler Inhale 2 puffs into the lungs every 6 (six) hours.   Yes Historical Provider, MD  levothyroxine (SYNTHROID, LEVOTHROID) 100 MCG tablet Take 110 mcg by mouth daily.    Yes Historical Provider, MD  lisinopril (PRINIVIL,ZESTRIL) 2.5 MG tablet Take 2.5 mg by mouth daily.   Yes Historical Provider, MD  Melatonin 10 MG TABS Take 1 tablet by mouth at bedtime.   Yes Historical Provider, MD  potassium citrate (UROCIT-K) 10 MEQ (1080 MG) SR tablet Take 10 mEq by mouth 3 (three) times daily with meals.   Yes Historical Provider, MD  pregabalin (LYRICA) 100 MG capsule Take 100 mg by mouth 3 (three) times daily.    Yes Historical Provider, MD      VITAL SIGNS:  Blood pressure (!) 108/94, pulse 66, temperature  98.3 F (36.8 C), temperature source Oral, resp. rate (!) 26, weight 83.9 kg (185 lb), SpO2 98 %.  PHYSICAL EXAMINATION:   Physical Exam  GENERAL:  82 y.o.-year-old patient lying in the bed with no acute distress.  EYES: Pupils equal, round, reactive to light and accommodation. No scleral icterus. Extraocular muscles intact.  HEENT: Head atraumatic, normocephalic. Oropharynx and nasopharynx clear.  NECK:  Supple, no jugular venous distention. No thyroid enlargement, no tenderness.  LUNGS: Normal breath sounds bilaterally, no wheezing, rales,rhonchi or crepitation. No use of accessory muscles of respiration. Decreased bibasilar breath sounds CARDIOVASCULAR: S1, S2 normal. No rubs, or gallops. 2/6 systolic murmur is present ABDOMEN: Soft, nontender, nondistended. Bowel sounds present. No organomegaly or mass.  EXTREMITIES: No pedal edema, cyanosis, or clubbing.  NEUROLOGIC: Cranial nerves II through XII are intact. Global weakness noted. Muscle strength 5/5 in right upper and lower extremity. Left upper extremity strength is 4 x 5 and lower extremity strength is 3 x 5. Sensation intact. Gait not checked.  PSYCHIATRIC: The patient is alert and oriented x 3. Slow to respond. Hard of hearing SKIN: No obvious  rash, lesion, or ulcer.   LABORATORY PANEL:   CBC  Recent Labs Lab 08/03/16 1157  WBC 8.6  HGB 11.3*  HCT 33.3*  PLT 208   ------------------------------------------------------------------------------------------------------------------  Chemistries   Recent Labs Lab 08/03/16 0726 08/03/16 1157  NA 136 136  K 3.8 3.8  CL 94* 94*  CO2 35* 36*  GLUCOSE 90 155*  BUN 14 16  CREATININE 0.68 0.80  CALCIUM 9.0 8.7*  MG 1.6*  --   AST 11* 12*  ALT 25 25  ALKPHOS 66 61  BILITOT 0.7 0.7   ------------------------------------------------------------------------------------------------------------------  Cardiac Enzymes No results for input(s): TROPONINI in the last 168  hours. ------------------------------------------------------------------------------------------------------------------  RADIOLOGY:  Ct Head Wo Contrast  Result Date: 08/03/2016 CLINICAL DATA:  82 year old female with recent subdural hematoma. Altered mental status starting this morning. Slow moving left arm. Subsequent encounter. EXAM: CT HEAD WITHOUT CONTRAST CT CERVICAL SPINE WITHOUT CONTRAST TECHNIQUE: Multidetector CT imaging of the head and cervical spine was performed following the standard protocol without intravenous contrast. Multiplanar CT image reconstructions of the cervical spine were also generated. COMPARISON:  07/22/2016 CT head and cervical spine. FINDINGS: CT HEAD FINDINGS Brain: Large broad-based right-sided subdural hematoma has decreased slightly in density consistent with red blood cell lysis. The overall size is slightly smaller with maximal thickness of 10.9 mm versus 13.2 mm. Compression of the right lateral ventricle with midline shift to left by 6.2 mm versus prior 12.2 mm. No new intracranial hemorrhage. Remote small right cerebellar infarct without CT evidence of large acute infarct. Vascular: Vascular calcifications. Skull: No skull fracture. Sinuses/Orbits: No acute orbital abnormality. Mild polypoid opacification right sphenoid sinus. Other: Negative. CT CERVICAL SPINE FINDINGS Alignment: Minimal anterior slip C3 felt to be related to facet degenerative changes. Skull base and vertebrae: Base of dens with mixed sclerotic/lucent appearance. This can be seen with base the dens fractures however, this is not confirmed as a fracture on coronal or axial imaging and may be normal for this patient or related to remote injury. No prevertebral soft tissue swelling as may be expected with acute fracture. This can be assessed with MR or flexion extension views if clinically desired. Subtle lucency superior right C6 facet. Minimal fracture at this level is a possibility Scalloping of  the posterior border of C7 vertebral body of indeterminate etiology. Fusion left C7-T1 facet joint. Soft tissues and spinal canal: No abnormal prevertebral soft tissue swelling. No worrisome neck mass. Disc levels: Various degrees of spinal stenosis and foraminal narrowing. On the right, foraminal narrowing most prominent C5-6 and C6-7 level and on the left at the C6-7 level. Upper chest: No worrisome mass visualized lung apices. Other: Negative Brain: 82 year old female with altered mental status. At revealed dictation facility post fall. Known subdural hematoma. IMPRESSION: CT HEAD Large broad-based right-sided subdural hematoma has decreased slightly in density consistent with red blood cell lysis. The overall size is slightly smaller with maximal thickness of 10.9 mm versus 13.2 mm. Compression of the right lateral ventricle with midline shift to left by 6.2 mm versus prior 12.2 mm. No new intracranial hemorrhage. Remote small right cerebellar infarct without CT evidence of large acute infarct. CT CERVICAL SPINE Base of dens with mixed sclerotic/lucent appearance may be normal for this patient or related to remote injury. No prevertebral soft tissue swelling as may be expected with acute fracture. My suspicion is that this does not represent an acute fracture. If further investigation is clinically desired, MR or flexion/extension views of cervical spine can  be obtained. Subtle lucency superior right C6 facet. Minimal fracture at this level is a possibility Scalloping of the posterior border of C7 vertebral body of indeterminate etiology. Various degrees of spinal stenosis and foraminal narrowing. These results were called by telephone at the time of interpretation on 08/03/2016 at 1:37 pm to Dr. Willy Eddy , who verbally acknowledged these results. Electronically Signed   By: Lacy Duverney M.D.   On: 08/03/2016 13:50   Ct Cervical Spine Wo Contrast  Result Date: 08/03/2016 CLINICAL DATA:  82 year old  female with recent subdural hematoma. Altered mental status starting this morning. Slow moving left arm. Subsequent encounter. EXAM: CT HEAD WITHOUT CONTRAST CT CERVICAL SPINE WITHOUT CONTRAST TECHNIQUE: Multidetector CT imaging of the head and cervical spine was performed following the standard protocol without intravenous contrast. Multiplanar CT image reconstructions of the cervical spine were also generated. COMPARISON:  07/22/2016 CT head and cervical spine. FINDINGS: CT HEAD FINDINGS Brain: Large broad-based right-sided subdural hematoma has decreased slightly in density consistent with red blood cell lysis. The overall size is slightly smaller with maximal thickness of 10.9 mm versus 13.2 mm. Compression of the right lateral ventricle with midline shift to left by 6.2 mm versus prior 12.2 mm. No new intracranial hemorrhage. Remote small right cerebellar infarct without CT evidence of large acute infarct. Vascular: Vascular calcifications. Skull: No skull fracture. Sinuses/Orbits: No acute orbital abnormality. Mild polypoid opacification right sphenoid sinus. Other: Negative. CT CERVICAL SPINE FINDINGS Alignment: Minimal anterior slip C3 felt to be related to facet degenerative changes. Skull base and vertebrae: Base of dens with mixed sclerotic/lucent appearance. This can be seen with base the dens fractures however, this is not confirmed as a fracture on coronal or axial imaging and may be normal for this patient or related to remote injury. No prevertebral soft tissue swelling as may be expected with acute fracture. This can be assessed with MR or flexion extension views if clinically desired. Subtle lucency superior right C6 facet. Minimal fracture at this level is a possibility Scalloping of the posterior border of C7 vertebral body of indeterminate etiology. Fusion left C7-T1 facet joint. Soft tissues and spinal canal: No abnormal prevertebral soft tissue swelling. No worrisome neck mass. Disc levels:  Various degrees of spinal stenosis and foraminal narrowing. On the right, foraminal narrowing most prominent C5-6 and C6-7 level and on the left at the C6-7 level. Upper chest: No worrisome mass visualized lung apices. Other: Negative Brain: 82 year old female with altered mental status. At revealed dictation facility post fall. Known subdural hematoma. IMPRESSION: CT HEAD Large broad-based right-sided subdural hematoma has decreased slightly in density consistent with red blood cell lysis. The overall size is slightly smaller with maximal thickness of 10.9 mm versus 13.2 mm. Compression of the right lateral ventricle with midline shift to left by 6.2 mm versus prior 12.2 mm. No new intracranial hemorrhage. Remote small right cerebellar infarct without CT evidence of large acute infarct. CT CERVICAL SPINE Base of dens with mixed sclerotic/lucent appearance may be normal for this patient or related to remote injury. No prevertebral soft tissue swelling as may be expected with acute fracture. My suspicion is that this does not represent an acute fracture. If further investigation is clinically desired, MR or flexion/extension views of cervical spine can be obtained. Subtle lucency superior right C6 facet. Minimal fracture at this level is a possibility Scalloping of the posterior border of C7 vertebral body of indeterminate etiology. Various degrees of spinal stenosis and foraminal narrowing. These results  were called by telephone at the time of interpretation on 08/03/2016 at 1:37 pm to Dr. Willy EddyPATRICK ROBINSON , who verbally acknowledged these results. Electronically Signed   By: Lacy DuverneySteven  Olson M.D.   On: 08/03/2016 13:50    EKG:   Orders placed or performed during the hospital encounter of 08/03/16  . ED EKG  . ED EKG  . EKG 12-Lead  . EKG 12-Lead  . EKG 12-Lead  . EKG 12-Lead    IMPRESSION AND PLAN:   Jinny BlossomWanda Antrim  is a 82 y.o. female with a known history of Atrial fibrillation currently not on any  anticoagulation due to recent subdural hemorrhage 2 weeks ago, congestive heart failure, COPD on home oxygen, diabetes, anemia, [facility] secondary to left-sided weakness.  #1 CVA- with left sided weakness- CT of the head with old infarct and stable subdural hemorrhage noted. -Patient has history of A. fib and currently off of anticoagulation due to her recent subdural bleed. -Physical therapy and occupational therapy consults requested. -Family wants to get her to assisted living with hospice following. -So no further tests or neurology consult ordered.  #2 diabetes mellitus-continue 70/30 insulin and also sliding scale insulin.  #3 hypertension-on lisinopril  #4 COPD-on chronic home O2. Continue her inhalers. Stable at this time.  #5 hypomagnesemia-being replaced  #6 DVT prophylaxis-due to her subdural hemorrhage, will just do Ted's and SCDs at this time    All the records are reviewed and case discussed with ED provider. Management plans discussed with the patient, family and they are in agreement.  CODE STATUS: DO NOT RESUSCITATE. Discussed with son over the phone  TOTAL TIME TAKING CARE OF THIS PATIENT: 50 minutes.    Enid BaasKALISETTI,Joseh Sjogren M.D on 08/03/2016 at 7:39 PM  Between 7am to 6pm - Pager - [phone]  After 6pm go to [email or web address] - password [email or web address]  [facility]  Office  [phone]  CC: Primary care physician; Pcp Not In System

## 2016-08-03 NOTE — ED Notes (Signed)
Pt given water, graham crackers and peanut butter 

## 2016-08-03 NOTE — ED Notes (Signed)
Patient transported to CT 

## 2016-08-03 NOTE — Progress Notes (Signed)
CSW faxed FL2 to [facility] via fax number [phone].  Rachel JordanLynn B Korbin Vazquez, MSW, Theresia MajorsLCSWA [phone]

## 2016-08-03 NOTE — ED Notes (Signed)
Pt diaper checked at this time, pt clean and dry. Pt pulled up in bed and glasses on pt.

## 2016-08-04 LAB — GLUCOSE, CAPILLARY
GLUCOSE-CAPILLARY: 123 mg/dL — AB (ref 65–99)
GLUCOSE-CAPILLARY: 189 mg/dL — AB (ref 65–99)

## 2016-08-04 LAB — BASIC METABOLIC PANEL
ANION GAP: 5 (ref 5–15)
BUN: 17 mg/dL (ref 6–20)
CALCIUM: 8.8 mg/dL — AB (ref 8.9–10.3)
CO2: 36 mmol/L — ABNORMAL HIGH (ref 22–32)
Chloride: 93 mmol/L — ABNORMAL LOW (ref 101–111)
Creatinine, Ser: 0.73 mg/dL (ref 0.44–1.00)
GFR calc Af Amer: >60 mL/min (ref >60)
GLUCOSE: 118 mg/dL — AB (ref 65–99)
POTASSIUM: 4 mmol/L (ref 3.5–5.1)
SODIUM: 134 mmol/L — AB (ref 135–145)

## 2016-08-04 LAB — CBC
HEMATOCRIT: 35.8 % (ref 35.0–47.0)
HEMOGLOBIN: 11.9 g/dL — AB (ref 12.0–16.0)
MCH: 30.1 pg (ref 26.0–34.0)
MCHC: 33.2 g/dL (ref 32.0–36.0)
MCV: 90.7 fL (ref 80.0–100.0)
Platelets: 239 10*3/uL (ref 150–440)
RBC: 3.94 MIL/uL (ref 3.80–5.20)
RDW: 13.9 % (ref 11.5–14.5)
WBC: 10.2 10*3/uL (ref 3.6–11.0)

## 2016-08-04 LAB — VITAMIN D 25 HYDROXY (VIT D DEFICIENCY, FRACTURES): Vit D, 25-Hydroxy: 59.4 ng/mL (ref 30.0–100.0)

## 2016-08-04 MED ORDER — ZOLPIDEM TARTRATE 5 MG PO TABS: 2.5000 mg | ORAL_TABLET | Freq: Every evening | ORAL | 0 refills | Status: AC | PRN

## 2016-08-04 MED ORDER — OXYCODONE HCL 5 MG PO TABS: 5.0000 mg | ORAL_TABLET | Freq: Four times a day (QID) | ORAL | 0 refills | Status: AC | PRN

## 2016-08-04 MED ORDER — INFLUENZA VAC SPLIT QUAD 0.5 ML IM SUSY
0.5000 mL | PREFILLED_SYRINGE | Freq: Once | INTRAMUSCULAR | Status: AC
  Administered 2016-08-04: 0.5 mL via INTRAMUSCULAR
  Filled 2016-08-04 (×2): qty 0.5

## 2016-08-04 NOTE — Clinical Social Work Note (Signed)
Clinical Social Work Assessment  Patient Details  Name: Jinny BlossomWanda Sekula MRN: [id number] Date of Birth: [birth year]  Date of referral:  08/04/16               Reason for consult:  Other (Comment Required) ([facility]. )                Permission sought to share information with:  Facility Industrial/product designerContact Representative Permission granted to share information::  Yes, Verbal Permission Granted  Name::        Agency::     Relationship::     Contact Information:     Housing/Transportation Living arrangements for the past 2 months:  Skilled Holiday representativeursing Facility, Assisted Living Facility Source of Information:  Adult Children, Power of Attorney Patient Interpreter Needed:  None Criminal Activity/Legal Involvement Pertinent to Current Situation/Hospitalization:  No - Comment as needed Significant Relationships:  Adult Children Lives with:  Facility Resident Do you feel safe going back to the place where you live?    Need for family participation in patient care:  Yes (Comment)  Care giving concerns: [facility] for short term rehab. Patient was a long term care resident at [facility].    Social Worker assessment / plan:  Visual merchandiserClinical Social Worker (CSW) received verbal [facility]. Per ED CSW notes patient's family would like for patient to return to [facility]. [facility] to discuss D/C plan. Per [facility] patient is at a skilled nursing level of care. PT is recommending SNF today. Kendal HymenBonnie reported that if patient is able to stand and pivot on her own then they would consider bringing patient back into [facility]. [facility] patient can return to [facility] today and continue rehab. CSW contacted patient's son Marita KansasVernon to discuss D/C plan. Son is agreeable for patient to return [facility] with palliative  care. CSW explained to son the difference between palliative care and hospice. Son would like for patient to eventually return to [facility] with Hospice.   Patient is medically stable for [facility]. Per [facility] will go to room 210-A. RN will call report at [phone] and arrange EMS for transport. CSW [facility] via [facility]. [facility] lia[facility] is aware of above. Patient's son Marita KansasVernon is agreeable for patient to return to [facility] today. Please reconsult if future social work needs arise. CSW signing off.    Employment status:  Retired Health and safety inspectornsurance information:  Medicare PT Recommendations:  Skilled Nursing Facility Information / Referral to community resources:  Skilled Nursing Facility  Patient/Family's Response to care:  Patient's son Marita KansasVernon is agreeable for patient to return to [facility] today.   Patient/Family's Understanding of and Emotional Response to Diagnosis, Current Treatment, and Prognosis:  Patient's son Marita KansasVernon was pleasant and thanked CSW for assistance.   Emotional Assessment Appearance:  Appears stated age Attitude/Demeanor/Rapport:  Unable to Assess Affect (typically observed):  Unable to Assess Orientation:  Oriented to Self, Fluctuating Orientation (Suspected and/or reported Sundowners) Alcohol / Substance use:  Not Applicable Psych involvement (Current and /or in the community):  No (Comment)  Discharge Needs  Concerns to be addressed:  Discharge Planning Concerns Readmission within the last 30 days:  No Current discharge risk:  Dependent with Mobility, Cognitively Impaired Barriers to Discharge:  Continued Medical Work up   [employer], [employer], LCSW 08/04/2016, 2:28 PM

## 2016-08-04 NOTE — Progress Notes (Signed)
[facility] with [facility], physical therapy has recommended short term rehab and family is agreeable. Plan is for [facility] to [facility] with [facility] to follow. [facility] referral made aware. Thank you. Dayna BarkerKaren Robertson RN, BSN, [facility], hospital Liaison [phone] c

## 2016-08-04 NOTE — Evaluation (Signed)
Physical Therapy Evaluation Patient Details Name: Rachel Vazquez Comley MRN: [id number] DOB: 1929-07-10 Today's Date: 08/04/2016   History of Present Illness  Rachel Vazquez Lemoine is a 82 y.o. female with a known history of atrial fibrillation currently not on any anticoagulation due to recent subdural hemorrhage 2 weeks ago, congestive heart failure, COPD on home oxygen, diabetes, anemia, recent admission [facility] for [facility] secondary to new onset left-sided weakness. History obtained from both patient and her son. Son reports that pt was being followed by Hospice for multiple months but recently discontinued services since patient was making significant improvement. Son states that he was told yesterday by [facility] that Hospice would start following patient again. No notes in system indicating Hospice assessment during this admission. Patient is originally from [facility]. She has been requiring assistance with ADLs/IADLs at [facility] for a long time however was able to ambulate facility distances with a rolling walker prior to her fall 2 weeks ago. Per son the staff [facility] noticed new onset left-sided weakness yesterday. [facility] was [facility] and [facility] of the head showing old right cerebellar infarct and no new infarct. Subdural hemorrhage is slightly decreased in size but still present. She had prior neck injury and chronic changes in the C-spine noted. The situation was presented to the family that this could be a stroke versus TIA especially with her atrial fibrillation and not being on anticoagulation. However with this subdural hemorrhage, she probably might not be a candidate for anticoagulation at this time. Family do not want any MRI or other workup done for stroke. Family and patient would like for patient to go back to [facility] but they would also like for patient to regain some of her function.  Clinical Impression  Pt  admitted with above diagnosis. Pt currently with functional limitations due to the deficits listed below (see PT Problem List). Pt presents with new onset of significant LUE/LLE weakness. She had a subdural hematoma two weeks ago but according to [facility] never presented with L sided deficits while admitted. According to patient and family she did not have any of these deficits during her stay at SNF until yesterday. Family has elected not to pursue stroke work-up and believes that Hospice will once again be following patient (please see HPI for more details). However there is no indication in the record that Hospice has agreed to follow this patient again. Family has deferred further stroke work-up. Pt presents with significant LUE/LLE weakness. She requires maxA+2 for bed mobility and attempted transfers. She presents with poor sitting balance requiring support to avoid falling to the left. Pt requires maxA+2 to stand and requires maxA+2 to remain upright due to falling to the left. Pt only able to stand from 3-5 second before falling. Unable/unsafe to attempt ambulation at this time. Pt At a minimum she needs a swallow screen to avoid aspirating. She would also benefit from OT evaluation to assess LUE function. Pt would benefit from SNF placement to attempt to regain as much function as possible with mobility in order to promote independence. However currently family and patient unsure how aggressive they would like to pursue therapy. Encouraged son to talk with discharge planning team. Pt will benefit from skilled PT services to address deficits in strength, balance, and mobility in order to return to full function at home.     Follow Up Recommendations SNF    Equipment Recommendations  Other (comment) (TBD at facility)    Recommendations for Other Services OT consult;Speech consult;Other (comment) (Needs swallow screen)     Precautions / Restrictions Precautions Precautions:  Fall Restrictions Weight Bearing Restrictions: No      Mobility  Bed Mobility Overal bed mobility: Needs Assistance Bed Mobility: Supine to Sit;Sit to Supine     Supine to sit: Max assist Sit to supine: Max assist;+2 for physical assistance   General bed mobility comments: Pt is severely weak and unable to utilize LUE for rolling to the R side. Requires maxA+1 for rolling and to go from R sidelying to sitting at EOB. Sitting balance is poor with patient leaning to the L requiring support to remain upright  Transfers Overall transfer level: Needs assistance Equipment used: Rolling walker (2 wheeled) Transfers: Sit to/from Stand Sit to Stand: Max assist;+2 physical assistance         General transfer comment: Pt with heavy leaning to the L during transfer. She requires hand over hand assist for safe hand placement and assist to bring LUE up onto walker due to weakness. Pt requires mod to maxA+2 to remain upright due to heavy leaning to the L. Pt is only able to attempt stand for approximatel 3-5 seconds before losing her balance and falling backwards. Unable/unsafe to attempt ambulation at this time  Ambulation/Gait             General Gait Details: Unable  Stairs            Wheelchair Mobility    Modified Rankin (Stroke Patients Only)       Balance Overall balance assessment: Needs assistance Sitting-balance support: Feet supported Sitting balance-Leahy Scale: Poor Sitting balance - Comments: Pt falling to the L requiring support to remain upright. Unable to self correct. No protective reactions when patient starts to fall. Pt unable to tolerate dynamic challenge to her balance   Standing balance support: Bilateral upper extremity supported Standing balance-Leahy Scale: Poor                               Pertinent Vitals/Pain Pain Assessment: 0-10 Pain Score: 4  Pain Location: Frontal headache and R posterior headache. RN notified Pain  Intervention(s): Other (comment);Monitored during session (RN notified)    Home Living Family/patient expects to be discharged to:: Unsure                 Additional Comments: Previously at [facility].    Prior Function Level of Independence: Needs assistance         Comments: See HPI for description of prior function and timeline     Hand Dominance   Dominant Hand: Right    Extremity/Trunk Assessment   Upper Extremity Assessment: LUE deficits/detail       LUE Deficits / Details: 2+ tone in LUE with flexed elbow posturing. Closed R grip but able to extend. Pt demonstrates 2/5 L elbow flexion/extension. Able to flex L shoulder to approximately 90 degrees. RUE: grossly WFL with at least 4+/5 strength throughout. Pt reports intact sensation   Lower Extremity Assessment: LLE deficits/detail   LLE Deficits / Details: RLE: Grossly WFL, at least 4+/5. LLE: Significant LLE weakness with 2/5 L hip flexion and L knee extension. Poor command follow vs weakness for L ankle DF. Prior medical record indicates that pt wears a brace on LLE, AFO? Not present in room. Pt with valgus deformity of L ankle. Reports intact sensation  to light touch throughout LLE     Communication   Communication: Other (comment);HOH (Speech is mildly slurred)  Cognition Arousal/Alertness: Awake/alert Behavior During Therapy: Flat affect Overall Cognitive Status: No family/caregiver present to determine baseline cognitive functioning (AOx3, slightly confused regarding situation)                      General Comments      Exercises     Assessment/Plan    PT Assessment Patient needs continued PT services  PT Problem List Decreased strength;Decreased activity tolerance;Decreased balance;Decreased mobility;Decreased coordination;Decreased knowledge of use of DME;Decreased safety awareness;Impaired tone          PT Treatment Interventions DME instruction;Gait training;Functional mobility  training;Therapeutic activities;Therapeutic exercise;Balance training;Neuromuscular re-education;Patient/family education;Manual techniques    PT Goals (Current goals can be found in the Care Plan section)  Acute Rehab PT Goals Patient Stated Goal: Recover as much mobility as possible PT Goal Formulation: With patient/family Time For Goal Achievement: 08/18/16 Potential to Achieve Goals: Fair    Frequency 7X/week   Barriers to discharge Decreased caregiver support At ALF, unclear if they can provide 24/7 assistance    Co-evaluation               End of Session Equipment Utilized During Treatment: Gait belt Activity Tolerance: Patient tolerated treatment well Patient left: in bed;with call bell/phone within reach;with bed alarm set Nurse Communication: Mobility status;Other (comment) (Need for swallow study and OT evaluation)    Functional Assessment Tool Used: clinical judgement Functional Limitation: Mobility: Walking and moving around Mobility: Walking and Moving Around Current Status (O1308): At least 80 percent but less than 100 percent impaired, limited or restricted Mobility: Walking and Moving Around Goal Status [phone]): At least 40 percent but less than 60 percent impaired, limited or restricted    Time: 0845-0915 PT Time Calculation (min) (ACUTE ONLY): 30 min   Charges:   PT Evaluation $PT Eval Moderate Complexity: 1 Procedure     PT G Codes:   PT G-Codes **NOT FOR INPATIENT CLASS** Functional Assessment Tool Used: clinical judgement Functional Limitation: Mobility: Walking and moving around Mobility: Walking and Moving Around Current Status (O9629): At least 80 percent but less than 100 percent impaired, limited or restricted Mobility: Walking and Moving Around Goal Status [phone]): At least 40 percent but less than 60 percent impaired, limited or restricted   Lynnea Maizes PT, DPT   Bahja Bence 08/04/2016, 10:26 AM

## 2016-08-04 NOTE — Discharge Instructions (Signed)
Heart healthy and ADA diet. Fall and aspiration precaution. With palliative care

## 2016-08-04 NOTE — Evaluation (Addendum)
Clinical/Bedside Swallow Evaluation Patient Details  Name: Rachel Vazquez MRN: [id number] Date of Birth: 09/29/1929  Today's Date: 08/04/2016 Time: SLP Start Time (ACUTE ONLY): 0900 SLP Stop Time (ACUTE ONLY): 1000 SLP Time Calculation (min) (ACUTE ONLY): 60 min  Past Medical History:  Past Medical History:  Diagnosis Date  . A-fib (HCC)   . Anemia   . CHF (congestive heart failure) (HCC)   . COPD (chronic obstructive pulmonary disease) (HCC)   . Diabetes mellitus without complication (HCC)   . Subdural hemorrhage (HCC)   . Thyroid disease   . UTI (urinary tract infection)    Past Surgical History:  Past Surgical History:  Procedure Laterality Date  . CORONARY ARTERY BYPASS GRAFT     HPI:  Pt is a 82 y.o. female with a known history of Atrial fibrillation currently not on any anticoagulation due to recent subdural hemorrhage 2 weeks ago, congestive heart failure, COPD on home oxygen, diabetes, anemia, recent admission to [facility] for subdural hemorrhage presents from [facility] rehabilitation secondary to left-sided weakness. Patient is originally from [facility] assisted [facility]. She was independent prior to her fall and subdural hemorrhage 2 weeks ago. CT of the head showing old right cerebellar infarct and no new infarct. Subdural hemorrhage is slightly decreased in size but still present. She had prior neck injury and chronic changes in the C-spine noted. The situation was presented to the family that this could be a stroke versus TIA especially with her atrial fibrillation and not being on anticoagulation. However with this subdural hemorrhage, she probably might not be a candidate for anticoagulation at this time. Family do not want any MRI or other workup done for stroke. They want her to get to [facility] after PT evaluation to evaluate her baseline at this time. They want hospice to follow the patient. Pt followed general instructions and verbally responded to SLP; A/O x2. Speech somewhat  slower and deliberate but 100% intelligible.    Assessment / Plan / Recommendation Clinical Impression  Pt appeared to adequately tolerate trials of thin liquids via cup/straw, purees and soft solids w/ no immeidate, overt s/s of aspiration noted; no decline in respiratory status or vocal quality during the po trials. Pt exhibited min labial residue on Left corner of mouth w/ po's; she was able to use lingual sweeping to clear this. Also educated pt on using lingual sweeping intraorally to clear any food debris if present. Suspect this presentation is related to the mild, Left labial weakness noted. Pt helped to feed self but required moderate support. Pt appears at reduced risk for aspiration following general aspiration precautions. Of note, pt appeared to prefer to attend to her Right side; she could look to the Left when SLP was positioned on that side but appeared to prefer the Right side. All movements, like her speech, were slower and deliberate. Pt does require assistance at meals d/t overall weakness and to ensure safety w/ oral intake.     Aspiration Risk   (reduced following precautions)    Diet Recommendation  Dysphagia 3 diet, thin liquids; general aspiration precautions. Tray setup at meals; assist as needed.    Medication Administration: Whole meds with puree    Other  Recommendations Recommended Consults:  (Dietician consult) Oral Care Recommendations: Oral care BID;Staff/trained caregiver to provide oral care   Follow up Recommendations Skilled Nursing facility (TBD)      Frequency and Duration min 2x/week  1 week       Prognosis Prognosis for Safe  Diet Advancement: Good      Swallow Study   General Date of Onset: 08/03/16 HPI: Pt is a 82 y.o. female with a known history of Atrial fibrillation currently not on any anticoagulation due to recent subdural hemorrhage 2 weeks ago, congestive heart failure, COPD on home oxygen, diabetes, anemia, [facility] for  [facility] hemorrhage presents [facility] secondary to left-sided weakness. Patient is originally from [facility]. She was independent prior to her fall and subdural hemorrhage 2 weeks ago. CT of the head showing old right cerebellar infarct and no new infarct. Subdural hemorrhage is slightly decreased in size but still present. She had prior neck injury and chronic changes in the C-spine noted. The situation was presented to the family that this could be a stroke versus TIA especially with her atrial fibrillation and not being on anticoagulation. However with this subdural hemorrhage, she probably might not be a candidate for anticoagulation at this time. Family do not want any MRI or other workup done for stroke. They want her to get to [facility] after PT evaluation to evaluate her baseline at this time. They want hospice to follow the patient. Pt followed general instructions and verbally responded to SLP; A/O x2. Speech somewhat slower and deliberate but 100% intelligible.  Type of Study: Bedside Swallow Evaluation Previous Swallow Assessment: none noted Diet Prior to this Study: Regular;Thin liquids Temperature Spikes Noted: No (wbc not elevated) Respiratory Status: Nasal cannula (3 liters) History of Recent Intubation: No Behavior/Cognition: Alert;Cooperative;Pleasant mood;Requires cueing;Distractible (HOH) Oral Cavity Assessment: Within Functional Limits Oral Care Completed by SLP: Recent completion by staff Oral Cavity - Dentition: Adequate natural dentition Vision: Functional for self-feeding Self-Feeding Abilities: Able to feed self;Needs assist;Needs set up;Total assist Patient Positioning: Upright in bed Baseline Vocal Quality: Low vocal intensity Volitional Cough: Strong Volitional Swallow: Able to elicit    Oral/Motor/Sensory Function Overall Oral Motor/Sensory Function: Mild impairment Facial ROM: Reduced left (min) Facial Symmetry: Abnormal  symmetry left (min) Facial Strength: Reduced left (min) Lingual ROM: Within Functional Limits Lingual Symmetry: Within Functional Limits Lingual Strength: Within Functional Limits Velum: Within Functional Limits Mandible: Within Functional Limits   Ice Chips Ice chips: Within functional limits Presentation: Spoon (fed; 4 trials)   Thin Liquid Thin Liquid: Within functional limits Presentation: Cup;Self Fed;Straw (~4-5 ozs total)    Nectar Thick Nectar Thick Liquid: Not tested   Honey Thick Honey Thick Liquid: Not tested   Puree Puree: Impaired Presentation: Spoon (fed; 10 trials) Oral Phase Functional Implications: Left anterior spillage (residue) Pharyngeal Phase Impairments:  (none)   Solid   GO   Solid: Impaired (mech soft foods) Presentation: Spoon (fed; 3 trials) Oral Phase Functional Implications: Left anterior spillage (residue) Pharyngeal Phase Impairments:  (none) Other Comments: needed foods more broken down and softened    Functional Assessment Tool Used: clinical judgement Functional Limitations: Swallowing Swallow Current Status (Z6109(G8996): At least 1 percent but less than 20 percent impaired, limited or restricted Swallow Goal Status [phone](G8997): At least 1 percent but less than 20 percent impaired, limited or restricted Swallow Discharge Status [phone](G8998): At least 1 percent but less than 20 percent impaired, limited or restricted    Jerilynn SomKatherine Watson, MS, CCC-SLP Watson,Katherine 08/04/2016,2:19 PM

## 2016-08-04 NOTE — Evaluation (Signed)
Occupational Therapy Evaluation Patient Details Name: Rachel Vazquez MRN: [id number] DOB: 12/19/1928 Today's Date: 08/04/2016    History of Present Illness Rachel Vazquez is an 82 y.o. female with a known history of atrial fibrillation currently not on any anticoagulation due to recent subdural hemorrhage 2 weeks ago, congestive heart failure, COPD on home oxygen, diabetes, anemia, [facility] secondary to new onset left-sided weakness.    Clinical Impression   Pt. presents with severe left sided leaning, pain, impaired LUE ROM, and impaired LUE functional use which hinder her ability to complete ADL tasks. Pt. could benefit from skilled OT services for ADL training, A/E training, neuro muscular re-ed, UE there. Ex., and pt./family education about positioning.    Follow Up Recommendations  SNF    Equipment Recommendations       Recommendations for Other Services PT consult     Precautions / Restrictions Precautions Precautions: Fall Restrictions Weight Bearing Restrictions: No                                                     ADL Overall ADL's : Needs assistance/impaired Eating/Feeding: Set up;Minimal assistance (Pt. uses RIght hand with extensive cues, complete set-up, and positioning.)   Grooming: Set up;Minimal assistance           Upper Body Dressing : Total assistance   Lower Body Dressing: Total assistance                       Vision     Perception     Praxis      Pertinent Vitals/Pain   9/10 in head. Pt. monitored throughout. Pt. Assisted with repositioning.     Hand Dominance Right   Extremity/Trunk Assessment Upper Extremity Assessment Upper Extremity Assessment: LUE deficits/detail LUE Deficits / Details: LUE weakness. Limited ROM in left shoulder, elbow, and digits.           Communication     Cognition Arousal/Alertness:  Awake/alert Behavior During Therapy: Flat affect Overall Cognitive Status: No family/caregiver present to determine baseline cognitive functioning                     General Comments       Exercises       Shoulder Instructions      Home Living Family/patient expects to be discharged to:: Skilled nursing facility   Available Help at Discharge: Family Type of Home: Assisted living                   Bathroom Accessibility: Yes       Additional Comments: Resides at [facility]      Prior Functioning/Environment Level of Independence: Needs assistance                 OT Problem List: Decreased strength;Decreased range of motion;Decreased knowledge of use of DME or AE;Impaired UE functional use;Pain;Decreased activity tolerance;Decreased coordination   OT Treatment/Interventions: Self-care/ADL training;Therapeutic exercise;Patient/family education;Therapeutic activities;DME and/or AE instruction;Manual therapy;Neuromuscular education    OT Goals(Current goals can be found in the care plan section) Acute Rehab OT Goals Patient Stated Goal: Pt. regain independence OT Goal Formulation: With patient Potential to Achieve Goals: Good  OT Frequency: Min 2X/week   Barriers to D/C:  Co-evaluation              End of Session    Activity Tolerance: Patient tolerated treatment well;Patient limited by lethargy;Patient limited by pain Patient left: in bed;with call bell/phone within reach;with bed alarm set   Time: 1300-1335 OT Time Calculation (min): 35 min Charges:  OT Evaluation $OT Eval Moderate Complexity: 1 Procedure OT Treatments $Self Care/Home Management : 8-22 mins G-Codes:    Olegario Messier, MS, OTR/L 08/04/2016, 2:06 PM

## 2016-08-04 NOTE — Plan of Care (Signed)
Problem: SLP Dysphagia Goals Goal: Misc Dysphagia Goal Pt will safely tolerate po diet of least restrictive consistency w/ no overt s/s of aspiration noted by Staff/pt/family x3 sessions.    

## 2016-08-04 NOTE — Care Management Obs Status (Signed)
MEDICARE OBSERVATION STATUS NOTIFICATION   Patient Details  Name: Rachel Vazquez MRN: [id number] Date of Birth: 08-Feb-1929   Medicare Observation Status Notification Given:  No (Discharge oreder less than 24 hours)    Caren MacadamMichelle Tamee Battin, RN 08/04/2016, 2:14 PM

## 2016-08-04 NOTE — Progress Notes (Signed)
While rounding, CH made initial visit to room 102. Pt was awake but not very responsive and appeared hard of hearing. Pt motioned to her head and asked for prayer about her headache. CH provided prayer and presence. CH is available for follow up as needed.    08/04/16 1100  Clinical Encounter Type  Visited With Patient  Visit Type Initial;Spiritual support  Referral From Nurse  Spiritual Encounters  Spiritual Needs Prayer

## 2016-08-04 NOTE — Progress Notes (Signed)
Palliative Medicine Consult Order Noted. Palliative Medicine Provider will [facility] to [facility] on Monday 08/07/2016, and patient will be evaluated then. Please call the Palliative Medicine Team office at [phone] if recommendations are needed in the interim.  Thank you for inviting us to see this patient.  [name] ChanceMelanie G. Philicia Heyne, RN, BSN, [facility] 08/04/2016 10:59 AM Cell [phone] 8:00-4:00 Monday-Friday Office [phone]

## 2016-08-04 NOTE — [place] (Signed)
[place] MEDICAID FL2 LEVEL OF CARE SCREENING TOOL     IDENTIFICATION  Patient Name: Rachel Vazquez Birthdate: [birth year] Sex: female Admission Date (Current Location): 08/03/2016  [place]   Facility and Address:  [facility], [address], [place], [place] [address]      Provider Number: [id number]  Attending Physician Name and Address:  Shaune PollackQing Chen, MD  Relative Name and Phone Number:       Current Level of Care: Hospital Recommended Level of Care: Skilled Nursing Facility Prior Approval Number: [id number]  Date Approved/Denied:   PASRR Number:  ( [id number] )  Discharge Plan: SNF    Current Diagnoses: Patient Active Problem List   Diagnosis Date Noted  . TIA (transient ischemic attack) 08/03/2016  . Diabetic neuropathy (HCC) 08/02/2016  . Traumatic subdural hemorrhage with loss of consciousness of unspecified duration, subsequent encounter 08/02/2016    Orientation RESPIRATION BLADDER Height & Weight     Self, Time, Situation, Place  O2 (3 Liters Oxygen ) Incontinent, Indwelling catheter Weight: 181 lb 14.4 oz (82.5 kg) Height:  5\' 6"  (167.6 cm)  BEHAVIORAL SYMPTOMS/MOOD NEUROLOGICAL BOWEL NUTRITION STATUS   (none)  (History of Subdural hemorrhage ) Continent Diet (DYS 3)  AMBULATORY STATUS COMMUNICATION OF NEEDS Skin   Extensive Assist Verbally Normal                       Personal Care Assistance Level of Assistance  Bathing, Dressing, Feeding Bathing Assistance: Limited assistance Feeding assistance: Independent Dressing Assistance: Limited assistance     Functional Limitations Info  Sight, Hearing, Speech Sight Info: Adequate Hearing Info: Adequate Speech Info: Adequate    SPECIAL CARE FACTORS FREQUENCY  PT (By licensed PT), OT (By licensed OT)     PT Frequency:  (5) OT Frequency:  (5)            Contractures Contractures Info: Not present    Additional Factors Info  Code  Status, Allergies Code Status Info:  (DNR ) Allergies Info:  (No Known Allergies. )           Current Medications (08/04/2016):  This is the current hospital active medication list Current Facility-Administered Medications  Medication Dose Route Frequency Provider Last Rate Last Dose  . acetaminophen (TYLENOL) tablet 650 mg  650 mg Oral Q6H PRN Enid Baasadhika Kalisetti, MD       Or  . acetaminophen (TYLENOL) suppository 650 mg  650 mg Rectal Q6H PRN Enid Baasadhika Kalisetti, MD      . calcium-vitamin D (OSCAL WITH D) 500-200 MG-UNIT per tablet 1 tablet  1 tablet Oral BID Enid Baasadhika Kalisetti, MD   1 tablet at 08/04/16 1057  . cholecalciferol (VITAMIN D) tablet 2,000 Units  2,000 Units Oral Daily Enid Baasadhika Kalisetti, MD   2,000 Units at 08/04/16 1058  . citalopram (CELEXA) tablet 40 mg  40 mg Oral Daily Enid Baasadhika Kalisetti, MD   40 mg at 08/04/16 1057  . famotidine (PEPCID) tablet 20 mg  20 mg Oral Daily Enid Baasadhika Kalisetti, MD   20 mg at 08/04/16 1057  . furosemide (LASIX) tablet 40 mg  40 mg Oral Daily Enid Baasadhika Kalisetti, MD   40 mg at 08/04/16 1057  . insulin aspart (novoLOG) injection 0-5 Units  0-5 Units Subcutaneous QHS Enid Baasadhika Kalisetti, MD      . insulin aspart (novoLOG) injection 0-9 Units  0-9 Units Subcutaneous TID WC Enid Baasadhika Kalisetti, MD   2 Units at 08/04/16 1352  . insulin  aspart protamine- aspart (NOVOLOG MIX 70/30) injection 11 Units  11 Units Subcutaneous Q breakfast Enid Baas, MD   11 Units at 08/04/16 0900  . ipratropium-albuterol (DUONEB) 0.5-2.5 (3) MG/3ML nebulizer solution 3 mL  3 mL Nebulization Q6H PRN Enid Baas, MD      . levothyroxine (SYNTHROID, LEVOTHROID) tablet 112 mcg  112 mcg Oral QAC breakfast Enid Baas, MD      . lisinopril (PRINIVIL,ZESTRIL) tablet 2.5 mg  2.5 mg Oral Daily Enid Baas, MD   2.5 mg at 08/04/16 1057  . Melatonin TABS 5 mg  5 mg Oral QHS Enid Baas, MD   5 mg at 08/03/16 2310  . mometasone-formoterol (DULERA) 200-5 MCG/ACT  inhaler 2 puff  2 puff Inhalation BID Enid Baas, MD   2 puff at 08/04/16 0900  . nystatin cream (MYCOSTATIN) 1 application  1 application Topical BID Enid Baas, MD   1 application at 08/04/16 1059  . ondansetron (ZOFRAN) tablet 4 mg  4 mg Oral Q6H PRN Enid Baas, MD       Or  . ondansetron (ZOFRAN) injection 4 mg  4 mg Intravenous Q6H PRN Enid Baas, MD      . oxyCODONE (Oxy IR/ROXICODONE) immediate release tablet 5 mg  5 mg Oral q morning - 10a Enid Baas, MD   5 mg at 08/04/16 1057  . oxyCODONE (Oxy IR/ROXICODONE) immediate release tablet 7.5 mg  7.5 mg Oral Q4H PRN Enid Baas, MD   7.5 mg at 08/03/16 2347  . polyethylene glycol (MIRALAX / GLYCOLAX) packet 17 g  17 g Oral Daily Enid Baas, MD   17 g at 08/04/16 1059  . polyvinyl alcohol (LIQUIFILM TEARS) 1.4 % ophthalmic solution 1 drop  1 drop Both Eyes BID PRN Enid Baas, MD      . pregabalin (LYRICA) capsule 100 mg  100 mg Oral Daily Enid Baas, MD   100 mg at 08/04/16 1059  . pregabalin (LYRICA) capsule 75 mg  75 mg Oral Daily Enid Baas, MD   75 mg at 08/04/16 1057  . zolpidem (AMBIEN) tablet 2.5 mg  2.5 mg Oral QHS PRN Enid Baas, MD         Discharge Medications: Please see discharge summary for a list of discharge medications.  Relevant Imaging Results:  Relevant Lab Results:   Additional Information  (SSN: [id number])  Gid Schoffstall, Darleen Crocker, LCSW

## 2016-08-04 NOTE — Discharge Summary (Signed)
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 A-fib (HCC)   . Anemia   . CHF (congestive heart failure) (HCC)   . COPD (chronic obstructive pulmonary disease) (HCC)   . Diabetes mellitus without complication (HCC)   . Subdural hemorrhage (HCC)   . Thyroid disease   . UTI (urinary tract infection)    HOSPITAL COURSE:  Rachel Vazquez  is a 82 y.o. female with a known history of Atrial fibrillation currently not on any anticoagulation due to recent subdural hemorrhage 2 weeks ago, congestive heart failure, COPD on home oxygen, diabetes, anemia, recent admission to [facility] for subdural hemorrhage presents from [facility] rehabilitation secondary to left-sided weakness.  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 -Patient has history of A. fib and currently off of anticoagulation due to her recent subdural bleed. -Physical therapy and occupational therapy consults: SNF. Per CM and SW, SNF with palliative care.  #2 diabetes mellitus-continue 70/30 insulin and also sliding scale insulin.  #3 hypertension-on lisinopril  #4 COPD-on chronic home O2. Continue her inhalers. Stable at this time.  #5 hypomagnesemia- replaced  Chronic pain and headache. Pt's son requested continuing her oxycodone and Ambien. DISCHARGE CONDITIONS:  Stable, discharge to SNF with palliative care today. CONSULTS OBTAINED:    DRUG ALLERGIES:  No Known Allergies DISCHARGE MEDICATIONS:     Medication List    TAKE these medications   acetaminophen 325 MG tablet Commonly known as:  TYLENOL Take 650 mg by mouth every 6 (six) hours as needed.   albuterol 108 (90 Base) MCG/ACT inhaler Commonly known as:  PROVENTIL HFA;VENTOLIN HFA Inhale 2 puffs into the lungs every 6 (six) hours as needed for wheezing or shortness of breath.   carvedilol 6.25 MG tablet Commonly known as:  COREG Take 6.25 mg by mouth 2 (two) times daily with a meal.   cefUROXime 500 MG tablet Commonly known as:  CEFTIN Take 500 mg by mouth 2 (two) times daily with a meal.   citalopram 40 MG tablet Commonly known as:  CELEXA Take 40 mg by mouth daily.   Fluticasone-Salmeterol 250-50 MCG/DOSE Aepb Commonly known as:  ADVAIR Inhale 1 puff into the lungs 2 (two) times daily.   furosemide 20 MG tablet Commonly known as:  LASIX Take 40 mg by mouth 2 (two) times daily.   gabapentin 100 MG capsule Commonly known as:  NEURONTIN Take 100 mg by mouth at bedtime.   insulin NPH Human 100 UNIT/ML injection Commonly known as:  HUMULIN N,NOVOLIN N Inject 5 Units into the skin 2 (two) times daily before a meal.   insulin regular 100 units/mL injection Commonly known as:  NOVOLIN R,HUMULIN R Inject 3-12 Units into the skin 3 (three) times daily before meals. And at bedtime as needed. Per sliding scale. If blood sugar is less than 60 call dr 176-250=3 units 251-325=6 units 326-450=10 units >450=12 units   ipratropium 17 MCG/ACT  inhaler Commonly known as:  ATROVENT HFA Inhale 2 puffs into the lungs every 6 (six) hours.   levothyroxine 100 MCG tablet Commonly known as:  SYNTHROID, LEVOTHROID Take 110 mcg by mouth daily.   lisinopril 2.5 MG tablet Commonly known as:  PRINIVIL,ZESTRIL Take 2.5 mg by mouth daily.   Melatonin 10 MG Tabs Take 1 tablet by mouth at bedtime.   oxyCODONE 5 MG immediate release tablet Commonly known as:   ROXICODONE Take 1 tablet (5 mg total) by mouth every 6 (six) hours as needed for severe pain. What changed:  how much to take  when to take this   potassium citrate 10 MEQ (1080 MG) SR tablet Commonly known as:  UROCIT-K Take 10 mEq by mouth 3 (three) times daily with meals.   pregabalin 100 MG capsule Commonly known as:  LYRICA Take 100 mg by mouth 3 (three) times daily.   zolpidem 5 MG tablet Commonly known as:  AMBIEN Take 0.5 tablets (2.5 mg total) by mouth at bedtime as needed for sleep.        DISCHARGE INSTRUCTIONS:  See AVS. If you experience worsening of your admission symptoms, develop shortness of breath, life threatening emergency, suicidal or homicidal thoughts you must seek medical attention immediately by calling 911 or calling your MD immediately  if symptoms less severe.  You Must read complete instructions/literature along with all the possible adverse reactions/side effects for all the Medicines you take and that have been prescribed to you. Take any new Medicines after you have completely understood and accpet all the possible adverse reactions/side effects.   Please note  You were cared for by a hospitalist during your hospital stay. If you have any questions about your discharge medications or the care you received while you were in the hospital after you are discharged, you can call the unit and asked to speak with the hospitalist on call if the hospitalist that took care of you is not available. Once you are discharged, your primary care physician will handle any further medical issues. Please note that NO REFILLS for any discharge medications will be authorized once you are discharged, as it is imperative that you return to your primary care physician (or establish a relationship with a primary care physician if you do not have one) for your aftercare needs so that they can reassess your need for medications and monitor your lab values.    On the day of  Discharge:  VITAL SIGNS:  Blood pressure (!) 104/51, pulse 63, temperature 98.1 F (36.7 C), temperature source Oral, resp. rate 18, height 5\' 6"  (1.676 m), weight 181 lb 14.4 oz (82.5 kg), SpO2 100 %. PHYSICAL EXAMINATION:  GENERAL:  82 y.o.-year-old patient lying in the bed with no acute distress.  EYES: Pupils equal, round, reactive to light and accommodation. No scleral icterus. Extraocular muscles intact.  HEENT: Head atraumatic, normocephalic. Oropharynx and nasopharynx clear.  NECK:  Supple, no jugular venous distention. No thyroid enlargement, no tenderness.  LUNGS: Normal breath sounds bilaterally, no wheezing, rales,rhonchi or crepitation. No use of accessory muscles of respiration.  CARDIOVASCULAR: S1, S2 normal. No murmurs, rubs, or gallops.  ABDOMEN: Soft, non-tender, non-distended. Bowel sounds present. No organomegaly or mass.  EXTREMITIES: No pedal edema, cyanosis, or clubbing.  NEUROLOGIC: Cranial nerves II through XII are intact. Muscle strength 4/5 in all extremities except left leg (2/5). Sensation intact. Gait not checked.  PSYCHIATRIC: The patient is alert and oriented x 3.  SKIN: No obvious rash, lesion,  or ulcer.  DATA REVIEW:   CBC  Recent Labs Lab 08/04/16 0448  WBC 10.2  HGB 11.9*  HCT 35.8  PLT 239    Chemistries   Recent Labs Lab 08/03/16 0726 08/03/16 1157 08/04/16 0448  NA 136 136 134*  K 3.8 3.8 4.0  CL 94* 94* 93*  CO2 35* 36* 36*  GLUCOSE 90 155* 118*  BUN 14 16 17   CREATININE 0.68 0.80 0.73  CALCIUM 9.0 8.7* 8.8*  MG 1.6*  --   --   AST 11* 12*  --   ALT 25 25  --   ALKPHOS 66 61  --   BILITOT 0.7 0.7  --      Microbiology Results  Results for orders placed or performed during the hospital encounter of 08/03/16  MRSA PCR Screening     Status: None   Collection Time: 08/03/16  9:22 PM  Result Value Ref Range Status   MRSA by PCR NEGATIVE NEGATIVE Final    Comment:        The GeneXpert MRSA Assay (FDA approved for NASAL  specimens only), is one component of a comprehensive MRSA colonization surveillance program. It is not intended to diagnose MRSA infection nor to guide or monitor treatment for MRSA infections.     RADIOLOGY:  No results found.   Management plans discussed with the patient, her son and they are in agreement.  CODE STATUS:     Code Status Orders        Start     Ordered   08/03/16 1937  Do not attempt resuscitation (DNR)  Continuous    Question Answer Comment  In the event of cardiac or respiratory ARREST Do not call a "code blue"   In the event of cardiac or respiratory ARREST Do not perform Intubation, CPR, defibrillation or ACLS   In the event of cardiac or respiratory ARREST Use medication by any route, position, wound care, and other measures to relive pain and suffering. May use oxygen, suction and manual treatment of airway obstruction as needed for comfort.      08/03/16 1939    Code Status History    Date Active Date Inactive Code Status Order ID Comments User Context   This patient has a current code status but no historical code status.    Advance Directive Documentation   Flowsheet Row Most Recent Value  Type of Advance Directive  Out of facility DNR (pink MOST or yellow form)  Pre-existing out of facility DNR order (yellow form or pink MOST form)  No data  "MOST" Form in Place?  No data      TOTAL TIME TAKING CARE OF THIS PATIENT: 46 minutes.    Shaune Pollack M.D on 08/04/2016 at 1:08 PM  Between 7am to 6pm - Pager - [phone]  After 6pm go to [email or web address] - Social research officer, government  [facility] [place] [facility]  Office  [phone]  CC: Primary care physician; Pcp Not In System   Note: This dictation was prepared with Dragon dictation along with smaller phrase technology. Any transcriptional errors that result from this process are unintentional.

## 2016-08-09 ENCOUNTER — Encounter: Admission: RE | Admit: 2016-08-09 | Payer: Medicare Other | Source: Ambulatory Visit | Admitting: Internal Medicine

## 2016-08-19 NOTE — Progress Notes (Signed)
Location:      Place of Service:  SNF (31) Provider:  Lorenso QuarryShannon Jameica Couts, NP-C  Pcp Not In System  Patient Care Team: Pcp Not In System as PCP - General  Extended Emergency Contact Information Primary Emergency Contact: Larmer,Vernon Address: [address]          [place], [place] [address] [place] Home Phone: [phone] Relation: Son  Code Status:  dnr Goals of care: Advanced Directive information Advanced Directives 08/03/2016  Does patient have an advance directive? Yes  Type of Advance Directive Out of facility DNR (pink MOST or yellow form)     Chief Complaint  Patient presents with  . Acute Visit    HPI:  Pt is a 82 y.o. female seen today for an acute visit for left arm weakness, left leg weakness and left facial droop. Physical Therapy was working with pt and noticed the changes. They then alerted nursing, who called me. Pt is lethargic but arouses easily. Oriented to baseline. Pt reports worse headache than before. Pt able to puff cheeks, raise eyebrows, Slight tongue deviation. Unable to follow 6 fields of gaze. Shoulder strength equal, grips diminished on left, equal push/pull with arms, left pronator drift, left foot plantar/dorsiflexion weaker than right. Sensations intact. Speech slightly slurred. Pt agreeable to go [facility] ED for evaluation. VSS. Family notified. EMS called for transport.    Past Medical History:  Diagnosis Date  . A-fib (HCC)   . Anemia   . CHF (congestive heart failure) (HCC)   . COPD (chronic obstructive pulmonary disease) (HCC)   . Diabetes mellitus without complication (HCC)   . Subdural hemorrhage (HCC)   . Thyroid disease   . UTI (urinary tract infection)    Past Surgical History:  Procedure Laterality Date  . CORONARY ARTERY BYPASS GRAFT      No Known Allergies    Medication List    Notice   This visit is on the same day as an admission, and a visit start time could not be determined. If the visit took place  after discharge, manually review the med list with the patient.     Review of Systems  Constitutional: Negative for activity change, appetite change, chills, diaphoresis and fever.  HENT: Negative for congestion, sneezing, sore throat, trouble swallowing and voice change.   Eyes: Negative for photophobia, pain, redness and visual disturbance.  Respiratory: Negative for apnea, cough, choking, chest tightness, shortness of breath and wheezing.   Cardiovascular: Negative for chest pain, palpitations and leg swelling.  Gastrointestinal: Negative for abdominal distention, abdominal pain, constipation, diarrhea and nausea.  Genitourinary: Negative for difficulty urinating, dysuria, frequency and urgency.  Musculoskeletal: Positive for arthralgias (typical arthritis). Negative for back pain, gait problem and myalgias.  Skin: Negative for color change, pallor, rash and wound.  Neurological: Positive for facial asymmetry and headaches. Negative for dizziness, tremors, syncope, speech difficulty, weakness and numbness.  Psychiatric/Behavioral: Negative for agitation and behavioral problems.  All other systems reviewed and are negative.   Immunization History  Administered Date(s) Administered  . Influenza,inj,Quad PF,36+ Mos 08/04/2016   Pertinent  Health Maintenance Due  Topic Date Due  . FOOT EXAM  08/08/1939  . OPHTHALMOLOGY EXAM  08[birth year]08/1939  . DEXA SCAN  08/08/1996  . PNA vac Low Risk Adult (1 of 2 - PCV13) 08/08/1996  . HEMOGLOBIN A1C  04/07/2014  . INFLUENZA VACCINE  Completed   No flowsheet data found. Functional Status Survey:    Vitals:   08/03/16  1042  BP: 115/64  Pulse: 79  SpO2: 98%   There is no height or weight on file to calculate BMI. Physical Exam  Constitutional: She is oriented to person, place, and time. Vital signs are normal. She appears well-developed and well-nourished. She appears lethargic. She is active and cooperative. She is easily aroused. She appears  ill. No distress. Nasal cannula in place.  HENT:  Head: Normocephalic and atraumatic.  Mouth/Throat: Uvula is midline, oropharynx is clear and moist and mucous membranes are normal. Mucous membranes are not pale, not dry and not cyanotic.  Eyes: Conjunctivae, EOM and lids are normal. Pupils are equal, round, and reactive to light.  Neck: Trachea normal, normal range of motion and full passive range of motion without pain. Neck supple. Normal carotid pulses and no JVD present. Carotid bruit is not present. No tracheal deviation, no edema and no erythema present. No thyromegaly present.  Cardiovascular: Normal rate, normal heart sounds, intact distal pulses and normal pulses.  An irregular rhythm present. Exam reveals no gallop, no distant heart sounds and no friction rub.   No murmur heard. Pulmonary/Chest: Effort normal and breath sounds normal. No accessory muscle usage. No respiratory distress. She has no decreased breath sounds. She has no wheezes. She has no rhonchi. She has no rales. She exhibits no tenderness.  Abdominal: Soft. Normal appearance and bowel sounds are normal. She exhibits no distension and no ascites. There is no tenderness.  Musculoskeletal: Normal range of motion. She exhibits no edema or tenderness.  Expected osteoarthritis, stiffness  Lymphadenopathy:       Head (right side): No submental, no submandibular and no tonsillar adenopathy present.       Head (left side): No submental, no submandibular and no tonsillar adenopathy present.    She has no cervical adenopathy.  Neurological: She is oriented to person, place, and time and easily aroused. She has normal strength. She appears lethargic. She is not disoriented. No sensory deficit. Coordination and gait abnormal.  Skin: Skin is warm, dry and intact. She is not diaphoretic. No cyanosis. There is pallor. Nails show no clubbing.  Psychiatric: She has a normal mood and affect. Her speech is normal and behavior is normal.  Judgment and thought content normal. Cognition and memory are normal.  Nursing note and vitals reviewed.   Labs reviewed:  Recent Labs  08/03/16 0726 08/03/16 1157 08/04/16 0448  NA 136 136 134*  K 3.8 3.8 4.0  CL 94* 94* 93*  CO2 35* 36* 36*  GLUCOSE 90 155* 118*  BUN 14 16 17   CREATININE 0.68 0.80 0.73  CALCIUM 9.0 8.7* 8.8*  MG 1.6*  --   --     Recent Labs  07/22/16 1601 08/03/16 0726 08/03/16 1157  AST 229* 11* 12*  ALT 457* 25 25  ALKPHOS 142* 66 61  BILITOT 1.0 0.7 0.7  PROT 7.4 6.6 6.2*  ALBUMIN 3.6 2.9* 2.8*    Recent Labs  07/22/16 1601 08/03/16 0726 08/03/16 1157 08/04/16 0448  WBC 12.6* 9.4 8.6 10.2  NEUTROABS 10.3* 7.3*  --   --   HGB 11.9* 11.3* 11.3* 11.9*  HCT 36.2 33.7* 33.3* 35.8  MCV 90.3 88.9 88.9 90.7  PLT 174 217 208 239   Lab Results  Component Value Date   TSH 0.202 (L) 08/03/2016   Lab Results  Component Value Date   HGBA1C 6.0 10/07/2013   No results found for: CHOL, HDL, LDLCALC, LDLDIRECT, TRIG, CHOLHDL  Significant Diagnostic Results in last  30 days:  Ct Head Wo Contrast  Result Date: 08/03/2016 CLINICAL DATA:  81 year old female with recent subdural hematoma. Altered mental status starting this morning. Slow moving left arm. Subsequent encounter. EXAM: CT HEAD WITHOUT CONTRAST CT CERVICAL SPINE WITHOUT CONTRAST TECHNIQUE: Multidetector CT imaging of the head and cervical spine was performed following the standard protocol without intravenous contrast. Multiplanar CT image reconstructions of the cervical spine were also generated. COMPARISON:  07/22/2016 CT head and cervical spine. FINDINGS: CT HEAD FINDINGS Brain: Large broad-based right-sided subdural hematoma has decreased slightly in density consistent with red blood cell lysis. The overall size is slightly smaller with maximal thickness of 10.9 mm versus 13.2 mm. Compression of the right lateral ventricle with midline shift to left by 6.2 mm versus prior 12.2 mm. No new  intracranial hemorrhage. Remote small right cerebellar infarct without CT evidence of large acute infarct. Vascular: Vascular calcifications. Skull: No skull fracture. Sinuses/Orbits: No acute orbital abnormality. Mild polypoid opacification right sphenoid sinus. Other: Negative. CT CERVICAL SPINE FINDINGS Alignment: Minimal anterior slip C3 felt to be related to facet degenerative changes. Skull base and vertebrae: Base of dens with mixed sclerotic/lucent appearance. This can be seen with base the dens fractures however, this is not confirmed as a fracture on coronal or axial imaging and may be normal for this patient or related to remote injury. No prevertebral soft tissue swelling as may be expected with acute fracture. This can be assessed with MR or flexion extension views if clinically desired. Subtle lucency superior right C6 facet. Minimal fracture at this level is a possibility Scalloping of the posterior border of C7 vertebral body of indeterminate etiology. Fusion left C7-T1 facet joint. Soft tissues and spinal canal: No abnormal prevertebral soft tissue swelling. No worrisome neck mass. Disc levels: Various degrees of spinal stenosis and foraminal narrowing. On the right, foraminal narrowing most prominent C5-6 and C6-7 level and on the left at the C6-7 level. Upper chest: No worrisome mass visualized lung apices. Other: Negative Brain: 82 year old female with altered mental status. At revealed dictation facility post fall. Known subdural hematoma. IMPRESSION: CT HEAD Large broad-based right-sided subdural hematoma has decreased slightly in density consistent with red blood cell lysis. The overall size is slightly smaller with maximal thickness of 10.9 mm versus 13.2 mm. Compression of the right lateral ventricle with midline shift to left by 6.2 mm versus prior 12.2 mm. No new intracranial hemorrhage. Remote small right cerebellar infarct without CT evidence of large acute infarct. CT CERVICAL SPINE  Base of dens with mixed sclerotic/lucent appearance may be normal for this patient or related to remote injury. No prevertebral soft tissue swelling as may be expected with acute fracture. My suspicion is that this does not represent an acute fracture. If further investigation is clinically desired, MR or flexion/extension views of cervical spine can be obtained. Subtle lucency superior right C6 facet. Minimal fracture at this level is a possibility Scalloping of the posterior border of C7 vertebral body of indeterminate etiology. Various degrees of spinal stenosis and foraminal narrowing. These results were called by telephone at the time of interpretation on 08/03/2016 at 1:37 pm to Dr. Willy Eddy , who verbally acknowledged these results. Electronically Signed   By: Lacy Duverney M.D.   On: 08/03/2016 13:50   Ct Head Wo Contrast  Result Date: 07/22/2016 CLINICAL DATA:  Unwitnessed fall.  Altered level consciousness. EXAM: CT HEAD WITHOUT CONTRAST CT CERVICAL SPINE WITHOUT CONTRAST TECHNIQUE: Multidetector CT imaging of the head and cervical  spine was performed following the standard protocol without intravenous contrast. Multiplanar CT image reconstructions of the cervical spine were also generated. COMPARISON:  None. FINDINGS: CT HEAD FINDINGS Brain: Large right subdural hematoma measuring up to 14 mm in thickness in the right frontal and parietal lobe. There is mass-effect on the right hemisphere with 12 mm midline shift to the left. There is compression of the right lateral ventricle. No other areas of hemorrhage. Negative for acute infarct or mass. 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 Vascular: No hyperdense vessel or unexpected calcification. Skull: Negative for skull fracture.  Right parietal scalp hematoma. Sinuses/Orbits: Mucosal edema in the paranasal sinuses. Air-fluid level right sphenoid sinus. Normal orbit bilaterally. Other: None CT CERVICAL SPINE FINDINGS Alignment: Image  quality degraded by significant motion. Mild anterior slip C3-4. Mild retrolisthesis C5-6. Skull base and vertebrae: Negative for fracture or mass. Nondisplaced fracture could be missed given the amount of motion. Soft tissues and spinal canal: Negative Disc levels: Cervical spondylosis throughout the cervical spine most prominent at C4-5, C5-6, C6-7 Upper chest: Negative Other: None IMPRESSION: Large right subdural hematoma 14 mm in thickness. 12 mm midline shift toward the left. No other area of acute hemorrhage or infarction. No skull fracture Sinusitis with air-fluid level in the sphenoid sinus felt to be related to sinusitis. No skull base fracture. Negative for cervical spine fracture however the study is significantly degraded by motion. Critical Value/emergent results were called by telephone at the time of interpretation on 07/22/2016 at 5:31 pm to Dr. Sharman Cheek , who verbally acknowledged these results. Electronically Signed   By: Marlan Palau M.D.   On: 07/22/2016 17:31   Ct Cervical Spine Wo Contrast  Result Date: 08/03/2016 CLINICAL DATA:  82 year old female with recent subdural hematoma. Altered mental status starting this morning. Slow moving left arm. Subsequent encounter. EXAM: CT HEAD WITHOUT CONTRAST CT CERVICAL SPINE WITHOUT CONTRAST TECHNIQUE: Multidetector CT imaging of the head and cervical spine was performed following the standard protocol without intravenous contrast. Multiplanar CT image reconstructions of the cervical spine were also generated. COMPARISON:  07/22/2016 CT head and cervical spine. FINDINGS: CT HEAD FINDINGS Brain: Large broad-based right-sided subdural hematoma has decreased slightly in density consistent with red blood cell lysis. The overall size is slightly smaller with maximal thickness of 10.9 mm versus 13.2 mm. Compression of the right lateral ventricle with midline shift to left by 6.2 mm versus prior 12.2 mm. No new intracranial hemorrhage. Remote  small right cerebellar infarct without CT evidence of large acute infarct. Vascular: Vascular calcifications. Skull: No skull fracture. Sinuses/Orbits: No acute orbital abnormality. Mild polypoid opacification right sphenoid sinus. Other: Negative. CT CERVICAL SPINE FINDINGS Alignment: Minimal anterior slip C3 felt to be related to facet degenerative changes. Skull base and vertebrae: Base of dens with mixed sclerotic/lucent appearance. This can be seen with base the dens fractures however, this is not confirmed as a fracture on coronal or axial imaging and may be normal for this patient or related to remote injury. No prevertebral soft tissue swelling as may be expected with acute fracture. This can be assessed with MR or flexion extension views if clinically desired. Subtle lucency superior right C6 facet. Minimal fracture at this level is a possibility Scalloping of the posterior border of C7 vertebral body of indeterminate etiology. Fusion left C7-T1 facet joint. Soft tissues and spinal canal: No abnormal prevertebral soft tissue swelling. No worrisome neck mass. Disc levels: Various degrees of spinal stenosis and foraminal narrowing. On the  right, foraminal narrowing most prominent C5-6 and C6-7 level and on the left at the C6-7 level. Upper chest: No worrisome mass visualized lung apices. Other: Negative Brain: 82 year old female with altered mental status. At revealed dictation facility post fall. Known subdural hematoma. IMPRESSION: CT HEAD Large broad-based right-sided subdural hematoma has decreased slightly in density consistent with red blood cell lysis. The overall size is slightly smaller with maximal thickness of 10.9 mm versus 13.2 mm. Compression of the right lateral ventricle with midline shift to left by 6.2 mm versus prior 12.2 mm. No new intracranial hemorrhage. Remote small right cerebellar infarct without CT evidence of large acute infarct. CT CERVICAL SPINE Base of dens with mixed  sclerotic/lucent appearance may be normal for this patient or related to remote injury. No prevertebral soft tissue swelling as may be expected with acute fracture. My suspicion is that this does not represent an acute fracture. If further investigation is clinically desired, MR or flexion/extension views of cervical spine can be obtained. Subtle lucency superior right C6 facet. Minimal fracture at this level is a possibility Scalloping of the posterior border of C7 vertebral body of indeterminate etiology. Various degrees of spinal stenosis and foraminal narrowing. These results were called by telephone at the time of interpretation on 08/03/2016 at 1:37 pm to Dr. Willy Eddy , who verbally acknowledged these results. Electronically Signed   By: Lacy Duverney M.D.   On: 08/03/2016 13:50   Ct Cervical Spine Wo Contrast  Result Date: 07/22/2016 CLINICAL DATA:  Unwitnessed fall.  Altered level consciousness. EXAM: CT HEAD WITHOUT CONTRAST CT CERVICAL SPINE WITHOUT CONTRAST TECHNIQUE: Multidetector CT imaging of the head and cervical spine was performed following the standard protocol without intravenous contrast. Multiplanar CT image reconstructions of the cervical spine were also generated. COMPARISON:  None. FINDINGS: CT HEAD FINDINGS Brain: Large right subdural hematoma measuring up to 14 mm in thickness in the right frontal and parietal lobe. There is mass-effect on the right hemisphere with 12 mm midline shift to the left. There is compression of the right lateral ventricle. No other areas of hemorrhage. Negative for acute infarct or mass. 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 Vascular: No hyperdense vessel or unexpected calcification. Skull: Negative for skull fracture.  Right parietal scalp hematoma. Sinuses/Orbits: Mucosal edema in the paranasal sinuses. Air-fluid level right sphenoid sinus. Normal orbit bilaterally. Other: None CT CERVICAL SPINE FINDINGS Alignment: Image quality  degraded by significant motion. Mild anterior slip C3-4. Mild retrolisthesis C5-6. Skull base and vertebrae: Negative for fracture or mass. Nondisplaced fracture could be missed given the amount of motion. Soft tissues and spinal canal: Negative Disc levels: Cervical spondylosis throughout the cervical spine most prominent at C4-5, C5-6, C6-7 Upper chest: Negative Other: None IMPRESSION: Large right subdural hematoma 14 mm in thickness. 12 mm midline shift toward the left. No other area of acute hemorrhage or infarction. No skull fracture Sinusitis with air-fluid level in the sphenoid sinus felt to be related to sinusitis. No skull base fracture. Negative for cervical spine fracture however the study is significantly degraded by motion. Critical Value/emergent results were called by telephone at the time of interpretation on 07/22/2016 at 5:31 pm to Dr. Sharman Cheek , who verbally acknowledged these results. Electronically Signed   By: Marlan Palau M.D.   On: 07/22/2016 17:31   Dg Chest Portable 1 View  Result Date: 07/22/2016 CLINICAL DATA:  Fall. EXAM: PORTABLE CHEST 1 VIEW COMPARISON:  April 18, 2014 FINDINGS: Elevation of the right hemidiaphragm  is again identified. Low-attenuation in the right upper quadrant is probably within air-filled bowel, including colon beneath the diaphragm. The cardiomediastinal silhouette is stable. No pneumothorax. No other acute abnormalities. IMPRESSION: No active disease. Electronically Signed   By: Gerome Samavid  Williams III M.D   On: 07/22/2016 16:54   Dg Hip Unilat W Or Wo Pelvis 2-3 Views Right  Result Date: 07/22/2016 CLINICAL DATA:  Pain after fall. EXAM: DG HIP (WITH OR WITHOUT PELVIS) 2-3V RIGHT COMPARISON:  October 02, 2013 FINDINGS: The patient is status post repair of a right hip fracture. A gamma nail remains in place. The intra medullary rod also remains in place. The distal interlocking screw has fractured. The rod itself is intact however. There is  deformity of the proximal right femur at the site of previous fracture. The findings could all be chronic. It would be difficult to exclude an acute on chronic fracture. The more distal femur is intact with no distal femoral fracture. Vascular calcifications are noted. IMPRESSION: The patient is status post right hip fracture repair in 2014. Deformity of the proximal right hip/ femur may all be chronic but it would be difficult to exclude an acute on chronic fracture. The distal interlocking screw associated with the distal femoral rod has fractured. Electronically Signed   By: Gerome Samavid  Williams III M.D   On: 07/22/2016 17:02    Assessment/Plan 1. Transient cerebral ischemia, unspecified type  Send pt to the ED via Emergency EMS to be evaluated for an acute CVA  Family/ staff Communication:   Total Time:  Documentation:  Face to Face:  Family/Phone:   Labs/tests ordered:    Medication list reviewed and assessed for continued appropriateness.  Brynda RimShannon H. Sherolyn Trettin, NP-C Geriatrics [facility] [phone] [place], [place] [address] Cell Phone (Mon-Fri 8am-5pm):  [phone] On Call:  [phone] & follow prompts after 5pm & weekends Office Phone:  [phone] Office Fax:  [phone]

## 2016-09-06 NOTE — Progress Notes (Signed)
   08/04/16 1406  Acute Rehab OT Goals  Patient Stated Goal Pt. regain independence  OT Goal Formulation With patient  Potential to Achieve Goals Good  OT Time Calculation  OT Start Time (ACUTE ONLY) 1300  OT Stop Time (ACUTE ONLY) 1335  OT Time Calculation (min) 35 min  OT G-codes **NOT FOR INPATIENT CLASS**  Functional Assessment Tool Used clinical judgement based on pt. current functional level  Functional Limitation Self care  Self Care Current Status (Z6109(G8987) CL  Self Care Goal Status (U0454(G8988) CK  OT Evaluation  $OT Eval Moderate Complexity 1 Procedure  OT Treatments  $Self Care/Home Management  8-22 mins  09/06/2016: Late Entry G-codes entered by Olegario MessierElaine Zamorah Ailes, MS, OTR/L. Evaluation/initial assessment was performed by Olegario MessierElaine Sascha Baugher, MS, OTR/L

## 2016-09-06 NOTE — Progress Notes (Signed)
   08/04/16 1405  Acute Rehab OT Goals  Patient Stated Goal Pt. regain independence  OT Goal Formulation With patient  Potential to Achieve Goals Good  OT Time Calculation  OT Start Time (ACUTE ONLY) 1300  OT Stop Time (ACUTE ONLY) 1335  OT Time Calculation (min) 35 min  OT G-codes **NOT FOR INPATIENT CLASS**  Functional Assessment Tool Used clinical judgement based on pt. current functional level  Functional Limitation Self care  Self Care Current Status (Z6109(G8987) CL  Self Care Goal Status (U0454(G8988) CK  OT Evaluation  $OT Eval Moderate Complexity 1 Procedure  OT Treatments  $Self Care/Home Management  8-22 mins     08/04/16 1406  Acute Rehab OT Goals  Patient Stated Goal Pt. regain independence  OT Goal Formulation With patient  Potential to Achieve Goals Good  OT Time Calculation  OT Start Time (ACUTE ONLY) 1300  OT Stop Time (ACUTE ONLY) 1335  OT Time Calculation (min) 35 min  OT G-codes **NOT FOR INPATIENT CLASS**  Functional Assessment Tool Used clinical judgement based on pt. current functional level  Functional Limitation Self care  Self Care Current Status (U9811(G8987) CL  Self Care Goal Status (B1478(G8988) CK  OT Evaluation  $OT Eval Moderate Complexity 1 Procedure  OT Treatments  $Self Care/Home Management  8-22 mins

## 2016-09-08 DEATH — deceased

## 2017-11-30 IMAGING — CR DG HIP (WITH OR WITHOUT PELVIS) 2-3V*R*
3 series · 4 of 4 positions shown · non-contrast
Comparison: October 02, 2013

CLINICAL DATA: Pain after fall.

EXAM:
DG HIP (WITH OR WITHOUT PELVIS) 2-3V RIGHT

[pelvis ap]
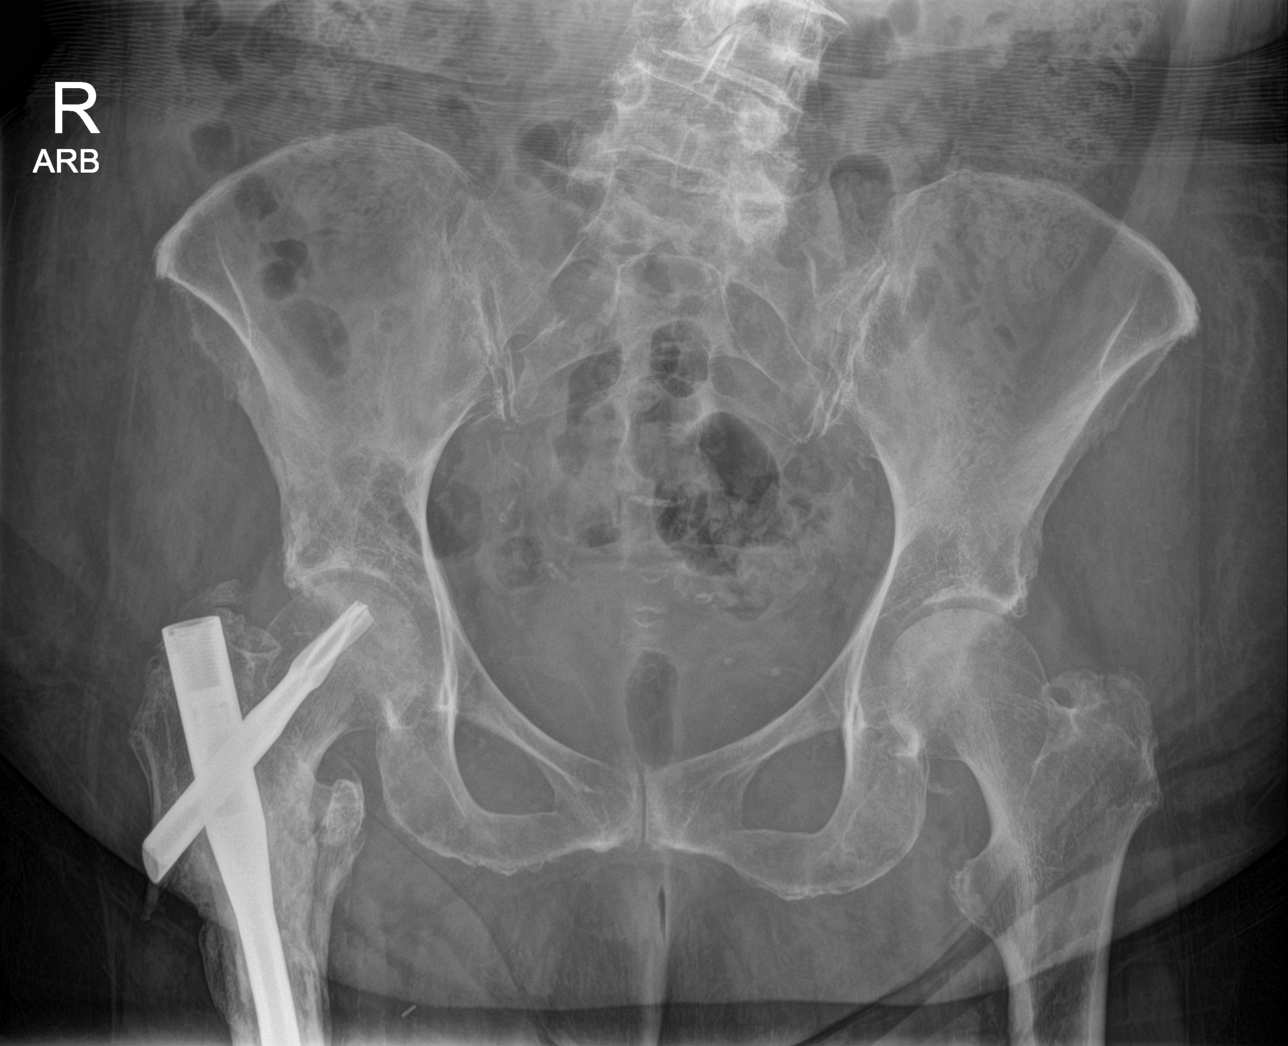

[Series 2: hip ap · 0.14mm/px · 2 of 2 slices shown]
[im 1/2]
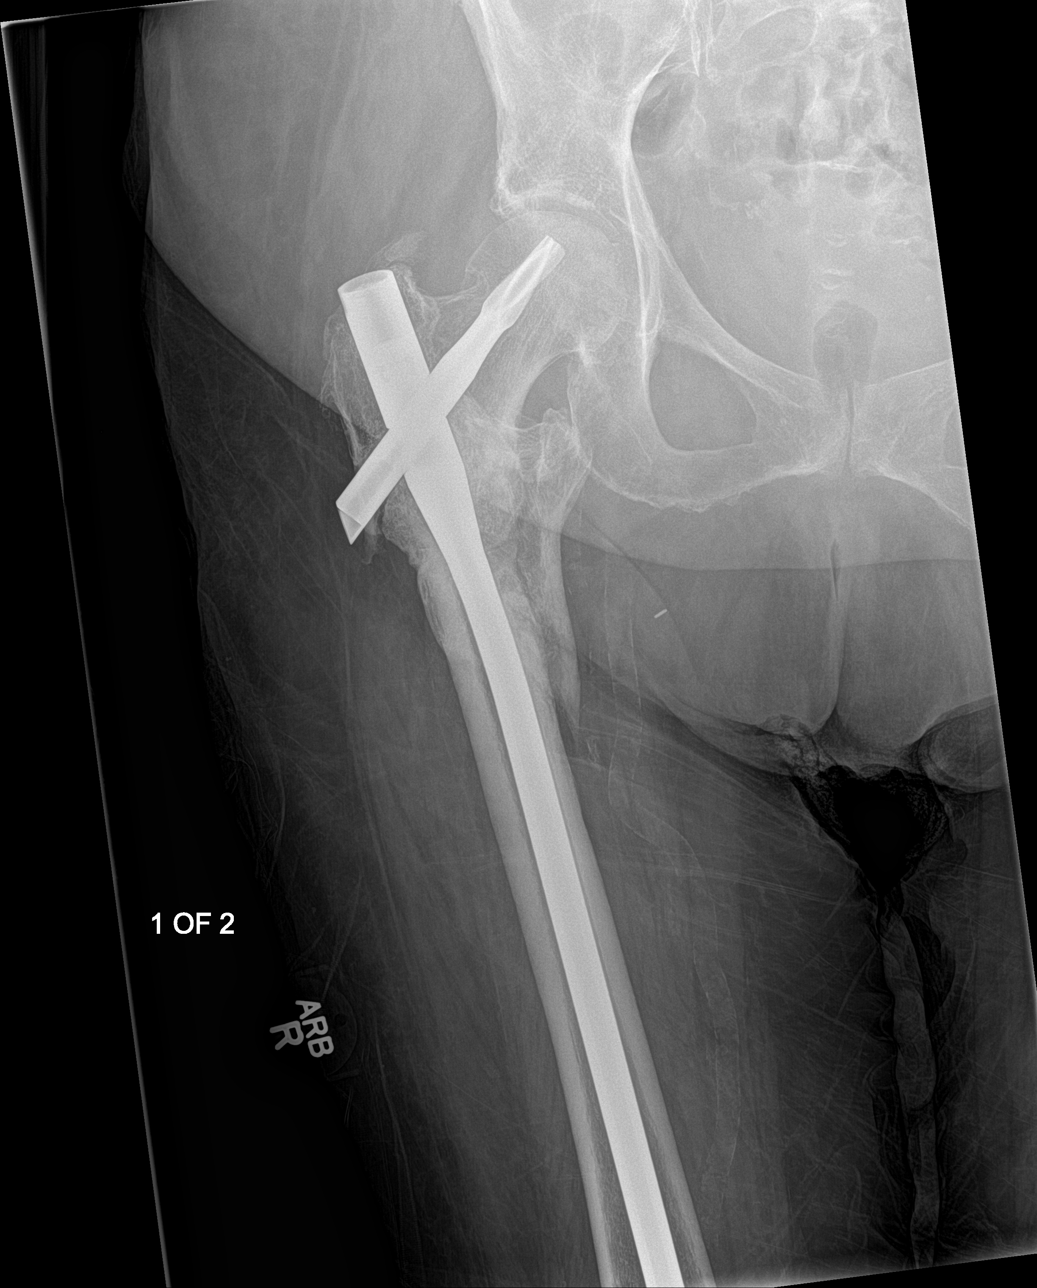
[im 2/2]
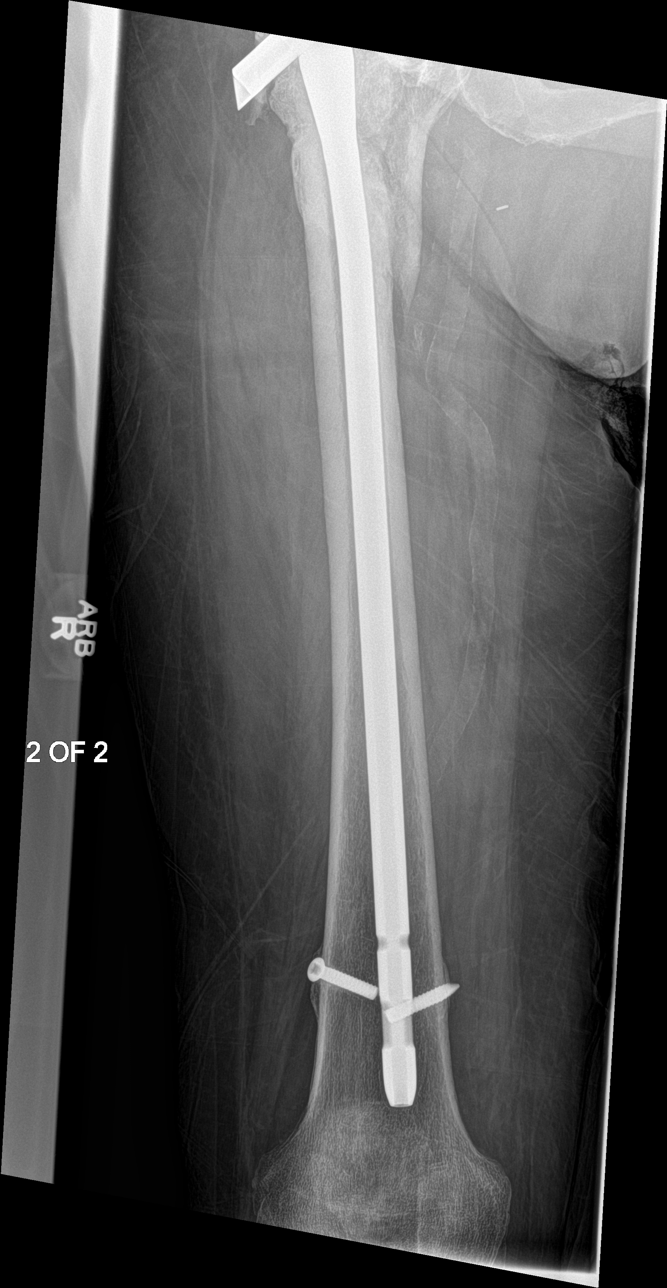

[hip lat]
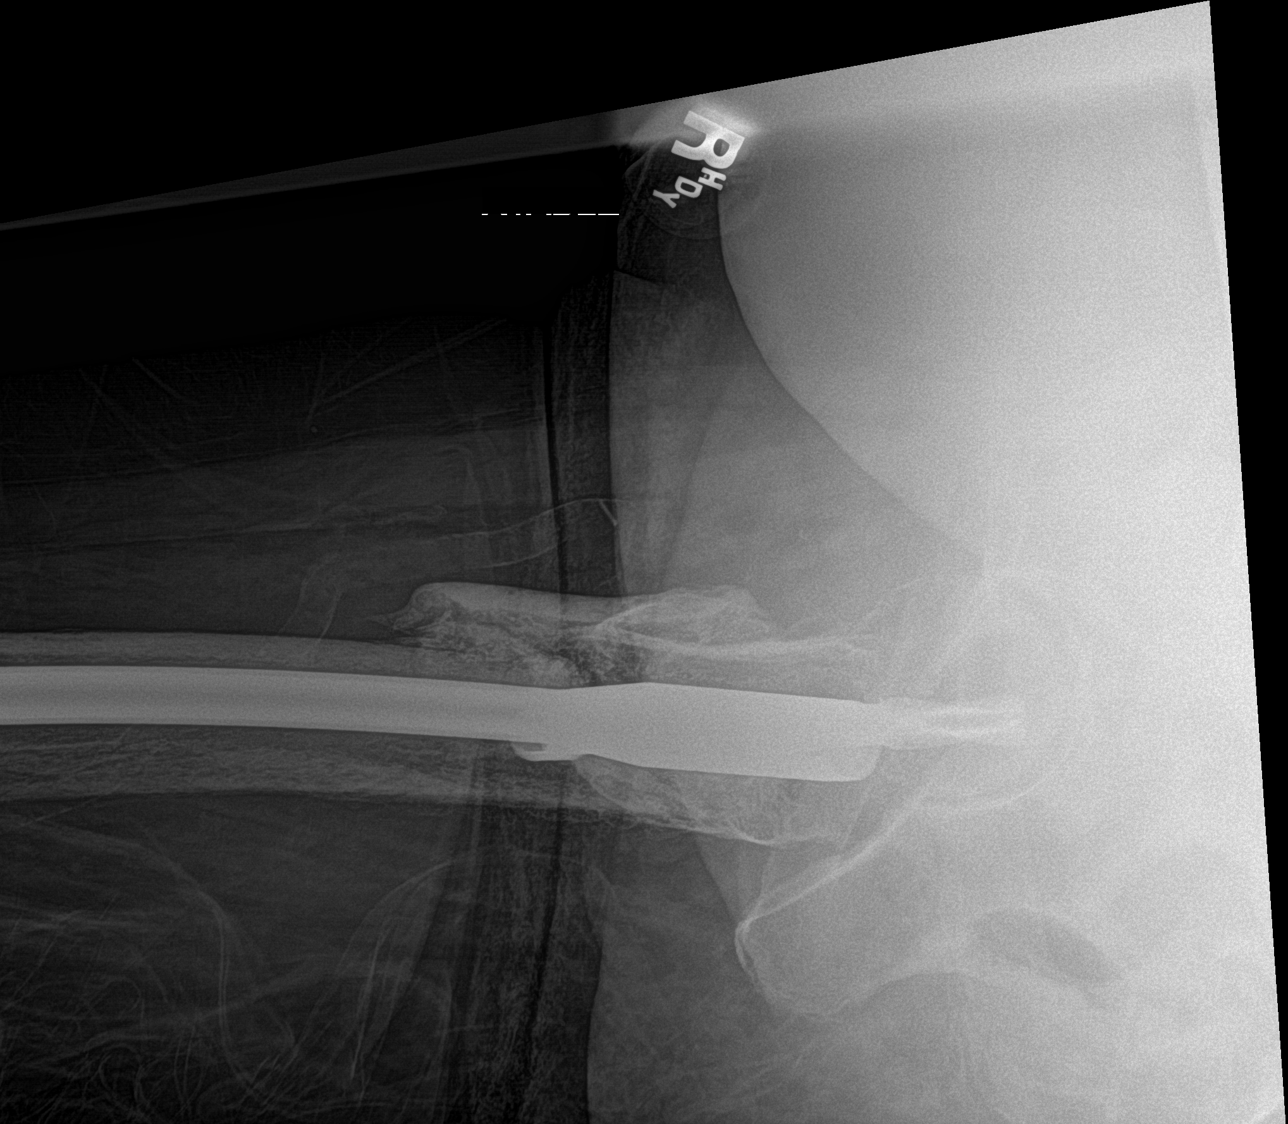

[4 of 4 positions shown; findings below may reference images not displayed]

FINDINGS: The patient is status post repair of a right hip fracture. A gamma
nail remains in place. The intra medullary rod also remains in
place. The distal interlocking screw has fractured. The rod itself
is intact however. There is deformity of the proximal right femur at
the site of previous fracture. The findings could all be chronic. It
would be difficult to exclude an acute on chronic fracture. The more
distal femur is intact with no distal femoral fracture. Vascular
calcifications are noted.
IMPRESSION: The patient is status post right hip fracture repair in [id number].
Deformity of the proximal right hip/ femur may all be chronic but it
would be difficult to exclude an acute on chronic fracture. The
distal interlocking screw associated with the distal femoral rod has
fractured.
